# Patient Record
Sex: Male | Born: 1950 | Race: White | Hispanic: No | Smoking: Never smoker
Health system: Southern US, Community
[De-identification: ages and names within clinical notes are randomized; demographics above are authoritative.]

## PROBLEM LIST (undated history)

## (undated) DIAGNOSIS — C61 Malignant neoplasm of prostate: Secondary | ICD-10-CM

## (undated) DIAGNOSIS — Z87442 Personal history of urinary calculi: Secondary | ICD-10-CM

## (undated) DIAGNOSIS — E785 Hyperlipidemia, unspecified: Secondary | ICD-10-CM

## (undated) DIAGNOSIS — Z86010 Personal history of colonic polyps: Secondary | ICD-10-CM

## (undated) DIAGNOSIS — M1990 Unspecified osteoarthritis, unspecified site: Secondary | ICD-10-CM

## (undated) DIAGNOSIS — T7840XA Allergy, unspecified, initial encounter: Secondary | ICD-10-CM

## (undated) DIAGNOSIS — Z973 Presence of spectacles and contact lenses: Secondary | ICD-10-CM

## (undated) DIAGNOSIS — N529 Male erectile dysfunction, unspecified: Secondary | ICD-10-CM

## (undated) DIAGNOSIS — J302 Other seasonal allergic rhinitis: Secondary | ICD-10-CM

## (undated) DIAGNOSIS — I491 Atrial premature depolarization: Secondary | ICD-10-CM

## (undated) DIAGNOSIS — Z860101 Personal history of adenomatous and serrated colon polyps: Secondary | ICD-10-CM

## (undated) DIAGNOSIS — Z87898 Personal history of other specified conditions: Secondary | ICD-10-CM

## (undated) HISTORY — DX: Hyperlipidemia, unspecified: E78.5

## (undated) HISTORY — PX: PROSTATE BIOPSY: SHX241

## (undated) HISTORY — DX: Allergy, unspecified, initial encounter: T78.40XA

## (undated) HISTORY — PX: KNEE ARTHROSCOPY: SUR90

## (undated) HISTORY — PX: COLONOSCOPY: SHX174

---

## 2004-12-18 ENCOUNTER — Ambulatory Visit: Payer: Self-pay | Admitting: Internal Medicine

## 2004-12-31 ENCOUNTER — Ambulatory Visit: Payer: Self-pay | Admitting: Internal Medicine

## 2005-01-13 ENCOUNTER — Ambulatory Visit: Payer: Self-pay | Admitting: Internal Medicine

## 2005-01-13 ENCOUNTER — Encounter: Payer: Self-pay | Admitting: Internal Medicine

## 2005-08-12 ENCOUNTER — Ambulatory Visit: Payer: Self-pay | Admitting: Internal Medicine

## 2006-01-22 ENCOUNTER — Ambulatory Visit: Payer: Self-pay | Admitting: Internal Medicine

## 2006-02-04 ENCOUNTER — Ambulatory Visit: Payer: Self-pay | Admitting: Internal Medicine

## 2006-02-11 ENCOUNTER — Ambulatory Visit: Payer: Self-pay | Admitting: Internal Medicine

## 2006-12-23 ENCOUNTER — Ambulatory Visit: Payer: Self-pay | Admitting: Internal Medicine

## 2007-02-19 ENCOUNTER — Ambulatory Visit: Payer: Self-pay | Admitting: Internal Medicine

## 2007-02-19 LAB — CONVERTED CEMR LAB
ALT: 18 units/L (ref 0–40)
AST: 20 units/L (ref 0–37)
Albumin: 4.1 g/dL (ref 3.5–5.2)
Alkaline Phosphatase: 46 units/L (ref 39–117)
BUN: 14 mg/dL (ref 6–23)
Basophils Absolute: 0 10*3/uL (ref 0.0–0.1)
Basophils Relative: 0.3 % (ref 0.0–1.0)
Bilirubin, Direct: 0.1 mg/dL (ref 0.0–0.3)
CO2: 31 meq/L (ref 19–32)
Calcium: 9.1 mg/dL (ref 8.4–10.5)
Chloride: 107 meq/L (ref 96–112)
Cholesterol: 190 mg/dL (ref 0–200)
Creatinine, Ser: 1 mg/dL (ref 0.4–1.5)
Eosinophils Absolute: 0.1 10*3/uL (ref 0.0–0.6)
Eosinophils Relative: 1.6 % (ref 0.0–5.0)
GFR calc Af Amer: 100 mL/min
GFR calc non Af Amer: 82 mL/min
Glucose, Bld: 96 mg/dL (ref 70–99)
HCT: 44.3 % (ref 39.0–52.0)
HDL: 43.3 mg/dL (ref >39.0)
Hemoglobin: 15 g/dL (ref 13.0–17.0)
LDL Cholesterol: 127 mg/dL — ABNORMAL HIGH (ref 0–99)
Lymphocytes Relative: 27 % (ref 12.0–46.0)
MCHC: 33.9 g/dL (ref 30.0–36.0)
MCV: 92.7 fL (ref 78.0–100.0)
Monocytes Absolute: 0.4 10*3/uL (ref 0.2–0.7)
Monocytes Relative: 7.1 % (ref 3.0–11.0)
Neutro Abs: 4.1 10*3/uL (ref 1.4–7.7)
Neutrophils Relative %: 64 % (ref 43.0–77.0)
PSA: 1.22 ng/mL (ref 0.10–4.00)
Platelets: 337 10*3/uL (ref 150–400)
Potassium: 3.8 meq/L (ref 3.5–5.1)
RBC: 4.79 M/uL (ref 4.22–5.81)
RDW: 12.4 % (ref 11.5–14.6)
Sodium: 141 meq/L (ref 135–145)
TSH: 1.28 microintl units/mL (ref 0.35–5.50)
Total Bilirubin: 1 mg/dL (ref 0.3–1.2)
Total CHOL/HDL Ratio: 4.4
Total Protein: 7 g/dL (ref 6.0–8.3)
Triglycerides: 99 mg/dL (ref 0–149)
VLDL: 20 mg/dL (ref 0–40)
WBC: 6.3 10*3/uL (ref 4.5–10.5)

## 2007-02-26 ENCOUNTER — Ambulatory Visit: Payer: Self-pay | Admitting: Internal Medicine

## 2007-03-04 DIAGNOSIS — R7611 Nonspecific reaction to tuberculin skin test without active tuberculosis: Secondary | ICD-10-CM

## 2007-03-04 DIAGNOSIS — Z86010 Personal history of colonic polyps: Secondary | ICD-10-CM

## 2007-06-03 DIAGNOSIS — J301 Allergic rhinitis due to pollen: Secondary | ICD-10-CM

## 2007-07-05 ENCOUNTER — Ambulatory Visit: Payer: Self-pay | Admitting: Family Medicine

## 2008-03-15 ENCOUNTER — Ambulatory Visit: Payer: Self-pay | Admitting: Internal Medicine

## 2008-03-15 LAB — CONVERTED CEMR LAB
ALT: 26 units/L (ref 0–53)
AST: 26 units/L (ref 0–37)
Alkaline Phosphatase: 36 units/L — ABNORMAL LOW (ref 39–117)
BUN: 12 mg/dL (ref 6–23)
Basophils Relative: 1 % (ref 0.0–1.0)
Blood in Urine, dipstick: NEGATIVE
CO2: 30 meq/L (ref 19–32)
Chloride: 109 meq/L (ref 96–112)
Eosinophils Absolute: 0.1 10*3/uL (ref 0.0–0.7)
Eosinophils Relative: 2.3 % (ref 0.0–5.0)
GFR calc non Af Amer: 82 mL/min
Glucose, Urine, Semiquant: NEGATIVE
HDL: 39.7 mg/dL (ref >39.0)
LDL Cholesterol: 128 mg/dL — ABNORMAL HIGH (ref 0–99)
Lymphocytes Relative: 31.6 % (ref 12.0–46.0)
MCV: 93.5 fL (ref 78.0–100.0)
Neutrophils Relative %: 58 % (ref 43.0–77.0)
Nitrite: NEGATIVE
Platelets: 303 10*3/uL (ref 150–400)
Potassium: 4.2 meq/L (ref 3.5–5.1)
RBC: 4.61 M/uL (ref 4.22–5.81)
Specific Gravity, Urine: 1.02
Total Bilirubin: 0.9 mg/dL (ref 0.3–1.2)
Total CHOL/HDL Ratio: 4.7
Urobilinogen, UA: 0.2
VLDL: 17 mg/dL (ref 0–40)
WBC Urine, dipstick: NEGATIVE
WBC: 6 10*3/uL (ref 4.5–10.5)

## 2008-03-29 ENCOUNTER — Ambulatory Visit: Payer: Self-pay | Admitting: Internal Medicine

## 2008-11-24 ENCOUNTER — Emergency Department (HOSPITAL_COMMUNITY): Admission: EM | Admit: 2008-11-24 | Discharge: 2008-11-24 | Payer: Self-pay | Admitting: Family Medicine

## 2008-11-24 ENCOUNTER — Telehealth: Payer: Self-pay | Admitting: Internal Medicine

## 2010-01-07 ENCOUNTER — Ambulatory Visit: Payer: Self-pay | Admitting: Internal Medicine

## 2010-01-07 DIAGNOSIS — L570 Actinic keratosis: Secondary | ICD-10-CM

## 2010-01-10 ENCOUNTER — Ambulatory Visit: Payer: Self-pay | Admitting: Internal Medicine

## 2010-01-10 LAB — CONVERTED CEMR LAB
ALT: 22 units/L (ref 0–53)
AST: 23 units/L (ref 0–37)
Albumin: 4.2 g/dL (ref 3.5–5.2)
Alkaline Phosphatase: 52 units/L (ref 39–117)
BUN: 19 mg/dL (ref 6–23)
Basophils Absolute: 0.1 10*3/uL (ref 0.0–0.1)
Basophils Relative: 0.7 % (ref 0.0–3.0)
Chloride: 105 meq/L (ref 96–112)
Creatinine, Ser: 1 mg/dL (ref 0.4–1.5)
Direct LDL: 141.4 mg/dL
Eosinophils Relative: 2.4 % (ref 0.0–5.0)
Glucose, Bld: 101 mg/dL — ABNORMAL HIGH (ref 70–99)
Hemoglobin, Urine: NEGATIVE
Hemoglobin: 15.2 g/dL (ref 13.0–17.0)
Ketones, ur: NEGATIVE mg/dL
Lymphocytes Relative: 30.2 % (ref 12.0–46.0)
Monocytes Relative: 6.6 % (ref 3.0–12.0)
Neutro Abs: 4.2 10*3/uL (ref 1.4–7.7)
Potassium: 5.1 meq/L (ref 3.5–5.1)
RBC: 4.77 M/uL (ref 4.22–5.81)
Total CHOL/HDL Ratio: 4
Total Protein, Urine: NEGATIVE mg/dL
Total Protein: 7.2 g/dL (ref 6.0–8.3)
Urine Glucose: NEGATIVE mg/dL
VLDL: 15.8 mg/dL (ref 0.0–40.0)
WBC: 7 10*3/uL (ref 4.5–10.5)

## 2010-01-18 ENCOUNTER — Ambulatory Visit: Payer: Self-pay | Admitting: Internal Medicine

## 2010-01-23 ENCOUNTER — Encounter (INDEPENDENT_AMBULATORY_CARE_PROVIDER_SITE_OTHER): Payer: Self-pay | Admitting: *Deleted

## 2010-09-23 ENCOUNTER — Encounter: Payer: Self-pay | Admitting: Internal Medicine

## 2010-10-01 NOTE — Assessment & Plan Note (Signed)
Summary: ?lump under freckle/njr   Vital Signs:  Patient profile:   60 year old male Weight:      203 pounds BMI:     30.31 Temp:     98.4 degrees F oral Pulse rate:   72 / minute Pulse rhythm:   regular Resp:     12 per minute BP sitting:   122 / 66  (left arm) Cuff size:   regular  Vitals Entered By: Gladis Riffle, RN (Jan 07, 2010 9:03 AM)  Procedure Note Last Tetanus: Td (02/26/2007)  Wart Removal: Onset of lesion: months-years Indication: aktinic keratosis  Procedure # 1: cryotherapy    Location: forehead    # lesions removed: 1    Comment: to   CC: c/o lump under freckle left forehead x 5 weeks Is Patient Diabetic? No   CC:  c/o lump under freckle left forehead x 5 weeks.  History of Present Illness: 5 week hx of mole left forehead discoloration has been there for years no pain minimal itching no bleeding/  All other systems reviewed and were negative   Preventive Screening-Counseling & Management  Alcohol-Tobacco     Smoking Status: never  Current Medications (verified): 1)  Ibuprofen 200 Mg Tabs (Ibuprofen) .... Take 1 Tablet By Mouth Every Eight Hours 2)  Zyrtec Allergy 10 Mg  Tabs (Cetirizine Hcl) .... Once Daily As Needed 3)  Claritin-D 12 Hour 5-120 Mg  Xr12h-Tab (Loratadine-Pseudoephedrine) .... Once Daily As Needed  Allergies (verified): No Known Drug Allergies  Physical Exam  General:  Well-developed,well-nourished,in no acute distress; alert,appropriate and cooperative throughout examination Head:  normocephalic and atraumatic.   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 Take 1 tablet by mouth every  eight hours 2)  Zyrtec Allergy 10 Mg Tabs (Cetirizine hcl) .... Once daily as needed 3)  Claritin-d 12 Hour 5-120 Mg Xr12h-tab (Loratadine-pseudoephedrine) .... Once daily as needed

## 2010-10-01 NOTE — Letter (Signed)
Summary: Colonoscopy Letter   Gastroenterology  [address] [place], [place] [address]   Phone: [phone]  Fax: [phone]      Jan 23, 2010 MRN: [id number]   [facility] [address] [place], [place]  [address]   Dear Mr. Creasey,   According to your medical record, it is time for you to schedule a Colonoscopy. The American Cancer Society recommends this procedure as a method to detect early colon cancer. Patients with a family history of colon cancer, or a personal history of colon polyps or inflammatory bowel disease are at increased risk.  This letter has beeen generated based on the recommendations made at the time of your procedure. If you feel that in your particular situation this may no longer apply, please contact our office.  Please call our office at [phone] to schedule this appointment or to update your records at your earliest convenience.  Thank you for cooperating with [place] to provide you with the very best care possible.   Sincerely,  Iva Boop, M.D.  [facility] Gastroenterology Division [phone]

## 2010-10-01 NOTE — Procedures (Signed)
Summary: Colonoscopy/[facility]  Colonoscopy/[facility]   Imported By: Lanelle Bal 01/18/2010 08:58:39  _____________________________________________________________________  External Attachment:    Type:   Image     Comment:   External Document

## 2010-10-01 NOTE — Assessment & Plan Note (Signed)
Summary: cpx/cjr   Vital Signs:  Patient profile:   60 year old male Height:      68.75 inches Weight:      202 pounds Temp:     97.3 degrees F oral BP sitting:   124 / 70  (left arm) Cuff size:   regular  Vitals Entered By: Kern Reap CMA Duncan Dull) (Jan 18, 2010 7:52 AM) CC: cpx   CC:  cpx.  History of Present Illness: cpx  Current Problems (verified): 1)  Actinic Keratosis  (ICD-702.0) 2)  Preventive Health Care  (ICD-V70.0) 3)  Allergic Rhinitis, Seasonal  (ICD-477.0) 4)  Positive Ppd  (ICD-795.5) 5)  Colonic Polyps, Hx of  (ICD-V12.72)  Current Medications (verified): 1)  Ibuprofen 200 Mg Tabs (Ibuprofen) .... Take 1 Tablet By Mouth Every Eight Hours 2)  Zyrtec Allergy 10 Mg  Tabs (Cetirizine Hcl) .... Once Daily As Needed 3)  Claritin-D 12 Hour 5-120 Mg  Xr12h-Tab (Loratadine-Pseudoephedrine) .... 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   Head:  normocephalic and atraumatic.   Eyes:  pupils equal and pupils round.   Ears:  R ear normal and L ear normal.   Nose:  no external deformity and no external erythema.   Neck:  No deformities, masses, or tenderness noted. Chest Wall:  No deformities, masses, tenderness or gynecomastia noted. Lungs:  normal respiratory effort and no intercostal retractions.   Heart:  normal rate and regular rhythm.    Abdomen:  soft and non-tender.   Rectal:  no external abnormalities and no hemorrhoids.   Msk:  No deformity or scoliosis noted of thoracic or lumbar spine.   Pulses:  R radial normal and L radial normal.   Neurologic:  cranial nerves II-XII intact and gait normal.   Skin:  well circumscribed lesion 1.2cm slightly raised, without hyperpigmentation Cervical Nodes:  no anterior cervical adenopathy and no posterior cervical adenopathy.   Psych:  memory intact for recent and remote.     Impression & Recommendations:  Problem # 1:  PREVENTIVE HEALTH CARE (ICD-V70.0) health maint utd advised daily exercise  Complete Medication List: 1)  Ibuprofen 200 Mg Tabs (Ibuprofen) .... Take 1 tablet by mouth every eight hours

## 2010-10-03 NOTE — Miscellaneous (Signed)
Summary: Orders Update   Clinical Lists Changes  Orders: Added new Referral order of Gastroenterology Referral (GI) - Signed 

## 2011-01-28 ENCOUNTER — Encounter: Payer: Self-pay | Admitting: Internal Medicine

## 2011-02-25 ENCOUNTER — Ambulatory Visit (AMBULATORY_SURGERY_CENTER): Payer: BC Managed Care – PPO | Admitting: *Deleted

## 2011-02-25 VITALS — Ht 69.0 in | Wt 196.7 lb

## 2011-02-25 DIAGNOSIS — Z86010 Personal history of colonic polyps: Secondary | ICD-10-CM

## 2011-02-25 MED ORDER — PEG-KCL-NACL-NASULF-NA ASC-C 100 G PO SOLR: ORAL | Status: DC

## 2011-03-11 ENCOUNTER — Encounter: Payer: Self-pay | Admitting: Internal Medicine

## 2011-03-11 ENCOUNTER — Ambulatory Visit (AMBULATORY_SURGERY_CENTER): Payer: BC Managed Care – PPO | Admitting: Internal Medicine

## 2011-03-11 VITALS — BP 107/66 | HR 56 | Temp 97.6°F | Resp 16 | Ht 69.0 in | Wt 193.0 lb

## 2011-03-11 DIAGNOSIS — K5730 Diverticulosis of large intestine without perforation or abscess without bleeding: Secondary | ICD-10-CM

## 2011-03-11 DIAGNOSIS — D126 Benign neoplasm of colon, unspecified: Secondary | ICD-10-CM

## 2011-03-11 DIAGNOSIS — Z1211 Encounter for screening for malignant neoplasm of colon: Secondary | ICD-10-CM

## 2011-03-11 DIAGNOSIS — Z86010 Personal history of colonic polyps: Secondary | ICD-10-CM

## 2011-03-11 MED ORDER — SODIUM CHLORIDE 0.9 % IV SOLN: 500.0000 mL | INTRAVENOUS | Status: DC

## 2011-03-11 NOTE — Patient Instructions (Signed)
Please review all discharge papers given to you by the recovery room nurse.  One polyp removed and mild diverticulosis in the sigmoid colon. Please call [phone] if you experience any problems once you are discharged.  You will receive a phone call in the am from one of our nurses to see how you are doing and answer any questions you may have.  Thank you

## 2011-03-12 ENCOUNTER — Telehealth: Payer: Self-pay | Admitting: *Deleted

## 2011-03-12 NOTE — Telephone Encounter (Signed)

## 2011-03-20 NOTE — Progress Notes (Signed)
Quick Note:  8 mm cecal adenoma Diverticulosis Internal hemorrhoids Colon recall 2017 ______

## 2012-06-16 ENCOUNTER — Ambulatory Visit: Payer: BC Managed Care – PPO

## 2013-02-15 ENCOUNTER — Other Ambulatory Visit (INDEPENDENT_AMBULATORY_CARE_PROVIDER_SITE_OTHER): Payer: BC Managed Care – PPO

## 2013-02-15 DIAGNOSIS — Z0000 Encounter for general adult medical examination without abnormal findings: Secondary | ICD-10-CM

## 2013-02-15 LAB — HEPATIC FUNCTION PANEL
ALT: 23 U/L (ref 0–53)
AST: 25 U/L (ref 0–37)
Alkaline Phosphatase: 45 U/L (ref 39–117)
Bilirubin, Direct: 0.1 mg/dL (ref 0.0–0.3)
Total Bilirubin: 0.6 mg/dL (ref 0.3–1.2)
Total Protein: 6.9 g/dL (ref 6.0–8.3)

## 2013-02-15 LAB — BASIC METABOLIC PANEL
BUN: 16 mg/dL (ref 6–23)
CO2: 23 mEq/L (ref 19–32)
Chloride: 107 mEq/L (ref 96–112)
Creatinine, Ser: 1 mg/dL (ref 0.4–1.5)
Glucose, Bld: 105 mg/dL — ABNORMAL HIGH (ref 70–99)

## 2013-02-15 LAB — POCT URINALYSIS DIPSTICK
Bilirubin, UA: NEGATIVE
Blood, UA: NEGATIVE
Glucose, UA: NEGATIVE
Leukocytes, UA: NEGATIVE
Nitrite, UA: POSITIVE

## 2013-02-15 LAB — LIPID PANEL
Cholesterol: 186 mg/dL (ref 0–200)
Total CHOL/HDL Ratio: 4
Triglycerides: 60 mg/dL (ref 0.0–149.0)

## 2013-02-15 LAB — CBC WITH DIFFERENTIAL/PLATELET
Basophils Absolute: 0 10*3/uL (ref 0.0–0.1)
Basophils Relative: 0.6 % (ref 0.0–3.0)
Eosinophils Absolute: 0.1 10*3/uL (ref 0.0–0.7)
MCHC: 34.1 g/dL (ref 30.0–36.0)
MCV: 92.4 fl (ref 78.0–100.0)
Monocytes Absolute: 0.6 10*3/uL (ref 0.1–1.0)
Neutrophils Relative %: 59.7 % (ref 43.0–77.0)
Platelets: 295 10*3/uL (ref 150.0–400.0)
RDW: 13.6 % (ref 11.5–14.6)

## 2013-02-15 LAB — TSH: TSH: 1.53 u[IU]/mL (ref 0.35–5.50)

## 2013-02-17 ENCOUNTER — Other Ambulatory Visit: Payer: Self-pay | Admitting: *Deleted

## 2013-02-17 DIAGNOSIS — R972 Elevated prostate specific antigen [PSA]: Secondary | ICD-10-CM

## 2013-02-17 MED ORDER — CIPROFLOXACIN HCL 500 MG PO TABS: 500.0000 mg | ORAL_TABLET | Freq: Two times a day (BID) | ORAL | Status: DC

## 2013-02-18 ENCOUNTER — Other Ambulatory Visit: Payer: BC Managed Care – PPO

## 2013-02-22 ENCOUNTER — Encounter: Payer: Self-pay | Admitting: Internal Medicine

## 2013-02-22 ENCOUNTER — Ambulatory Visit (INDEPENDENT_AMBULATORY_CARE_PROVIDER_SITE_OTHER): Payer: BC Managed Care – PPO | Admitting: Internal Medicine

## 2013-02-22 VITALS — BP 132/82 | HR 68 | Temp 98.4°F | Ht 69.5 in | Wt 194.0 lb

## 2013-02-22 DIAGNOSIS — Z2911 Encounter for prophylactic immunotherapy for respiratory syncytial virus (RSV): Secondary | ICD-10-CM

## 2013-02-22 DIAGNOSIS — Z0000 Encounter for general adult medical examination without abnormal findings: Secondary | ICD-10-CM

## 2013-02-24 NOTE — Progress Notes (Signed)
Patient ID: Randy Rogers, male   DOB: 04/08/1951, 62 y.o.   MRN: [id number] cpx  History reviewed. No pertinent past medical history.  History   Social History  . Marital Status: Married    Spouse Name: N/A    Number of Children: N/A  . Years of Education: N/A   Occupational History  . Not on file.   Social History Main Topics  . Smoking status: Never Smoker   . Smokeless tobacco: Not on file  . Alcohol Use: 7.0 oz/week    14 drink(s) per week  . Drug Use: No  . Sexually Active: Not on file   Other Topics Concern  . Not on file   Social History Narrative   Adopted by grandparents      Works for [employer]          Past Surgical History  Procedure Laterality Date  . Colonoscopy  2006; 03/11/2011    5 mm polyp removed; 8mm cecal polyp and diverticulosis  . Knee arthroscopy      left    Family History  Problem Relation Age of Onset  . Tuberculosis Mother     No Known Allergies  Current Outpatient Prescriptions on File Prior to Visit  Medication Sig Dispense Refill  . ciprofloxacin (CIPRO) 500 MG tablet Take 1 tablet (500 mg total) by mouth 2 (two) times daily.  20 tablet  0  . fexofenadine (ALLEGRA) 180 MG tablet Take 180 mg by mouth as needed.        Marland Kitchen ibuprofen (ADVIL,MOTRIN) 200 MG tablet Take 200 mg by mouth every 6 (six) hours as needed.         Current Facility-Administered Medications on File Prior to Visit  Medication Dose Route Frequency Provider Last Rate Last Dose  . 0.9 %  sodium chloride infusion  500 mL Intravenous Continuous Iva Boop, MD         patient denies chest pain, shortness of breath, orthopnea. Denies lower extremity edema, abdominal pain, change in appetite, change in bowel movements. Patient denies rashes, musculoskeletal complaints. No other specific complaints in a complete review of systems.   BP 132/82  Pulse 68  Temp(Src) 98.4 F (36.9 C) (Oral)  Ht 5' 9.5" (1.765 m)  Wt 194 lb (87.998 kg)  BMI 28.25 kg/m2  well-developed well-nourished male in no acute distress. HEENT exam atraumatic, normocephalic, neck supple without jugular venous distention. Chest clear to auscultation cardiac exam S1-S2 are regular. Abdominal exam overweight with bowel sounds, soft and nontender. Extremities no edema. Neurologic exam is alert with a normal gait.  Well visit- health maint utd

## 2013-04-04 ENCOUNTER — Other Ambulatory Visit (INDEPENDENT_AMBULATORY_CARE_PROVIDER_SITE_OTHER): Payer: BC Managed Care – PPO

## 2013-04-04 DIAGNOSIS — R972 Elevated prostate specific antigen [PSA]: Secondary | ICD-10-CM

## 2014-04-10 ENCOUNTER — Encounter: Payer: Self-pay | Admitting: Physician Assistant

## 2014-04-10 ENCOUNTER — Ambulatory Visit (INDEPENDENT_AMBULATORY_CARE_PROVIDER_SITE_OTHER): Payer: BC Managed Care – PPO | Admitting: Physician Assistant

## 2014-04-10 VITALS — BP 120/80 | HR 66 | Temp 98.1°F | Resp 18 | Wt 208.0 lb

## 2014-04-10 DIAGNOSIS — L259 Unspecified contact dermatitis, unspecified cause: Secondary | ICD-10-CM

## 2014-04-10 MED ORDER — TRIAMCINOLONE ACETONIDE 0.025 % EX OINT: 1.0000 "application " | TOPICAL_OINTMENT | Freq: Four times a day (QID) | CUTANEOUS | Status: DC

## 2014-04-10 NOTE — Progress Notes (Signed)
Subjective:    Patient ID: Randy Rogers, male    DOB: 12/13/1950, 63 y.o.   MRN: [id number]  Rash This is a new problem. The current episode started 1 to 4 weeks ago. The problem has been gradually worsening since onset. The affected locations include the right ankle. The rash is characterized by redness, itchiness and blistering. It is unknown if there was an exposure to a precipitant. Pertinent negatives include no anorexia, congestion, cough, diarrhea, eye pain, facial edema, fatigue, fever, joint pain, nail changes, rhinorrhea, shortness of breath, sore throat or vomiting. Treatments tried: antifungal cream, antibiotic ointment, topical antihistamine. The treatment provided no relief. There is no history of allergies, asthma or eczema.      Review of Systems  Constitutional: Negative for fever, chills and fatigue.  HENT: Negative for congestion, rhinorrhea and sore throat.   Eyes: Negative for pain.  Respiratory: Negative for cough and shortness of breath.   Cardiovascular: Negative for chest pain.  Gastrointestinal: Negative for nausea, vomiting, diarrhea and anorexia.  Musculoskeletal: Negative for joint pain.  Skin: Positive for rash. Negative for nail changes.  Neurological: Negative for syncope and headaches.  All other systems reviewed and are negative.      History reviewed. No pertinent past medical history.  History   Social History  . Marital Status: Married    Spouse Name: N/A    Number of Children: N/A  . Years of Education: N/A   Occupational History  . Not on file.   Social History Main Topics  . Smoking status: Never Smoker   . Smokeless tobacco: Not on file  . Alcohol Use: 7.0 oz/week    14 drink(s) per week  . Drug Use: No  . Sexual Activity: Not on file   Other Topics Concern  . Not on file   Social History Narrative   Adopted by grandparents      Works for [employer]          Past Surgical History  Procedure Laterality Date    . Colonoscopy  2006; 03/11/2011    5 mm polyp removed; 63mm cecal polyp and diverticulosis  . Knee arthroscopy      left    Family History  Problem Relation Age of Onset  . Tuberculosis Mother     No Known Allergies  Current Outpatient Prescriptions on File Prior to Visit  Medication Sig Dispense Refill  . fexofenadine (ALLEGRA) 180 MG tablet Take 180 mg by mouth as needed.        Marland Kitchen ibuprofen (ADVIL,MOTRIN) 200 MG tablet Take 200 mg by mouth every 6 (six) hours as needed.         Current Facility-Administered Medications on File Prior to Visit  Medication Dose Route Frequency Provider Last Rate Last Dose  . 0.9 %  sodium chloride infusion  500 mL Intravenous Continuous Gatha Mayer, MD        EXAM: BP 120/80  Pulse 66  Temp(Src) 98.1 F (36.7 C) (Oral)  Resp 18  Wt 208 lb (94.348 kg)     Objective:   Physical Exam  Nursing note and vitals reviewed. Constitutional: He is oriented to person, place, and time. He appears well-developed and well-nourished. No distress.  HENT:  Head: Normocephalic and atraumatic.  Eyes: Conjunctivae and EOM are normal. Pupils are equal, round, and reactive to light.  Cardiovascular: Normal rate, regular rhythm and intact distal pulses.   Pulmonary/Chest: Effort normal and breath sounds normal. No respiratory distress.  Neurological:  He is alert and oriented to person, place, and time.  Skin: Skin is warm and dry. Rash noted. He is not diaphoretic. No pallor.  Right ankle- there is a localized area of erythematous papular rash over the medial right ankle. This is not warm to the touch, not tender to palpation, not fluctuant, not swollen. There is no sign of a break in skin or puncture.  Psychiatric: He has a normal mood and affect. His behavior is normal. Judgment and thought content normal.    Lab Results  Component Value Date   WBC 7.0 02/15/2013   HGB 15.2 02/15/2013   HCT 44.4 02/15/2013   PLT 295.0 02/15/2013   GLUCOSE 105* 02/15/2013    CHOL 186 02/15/2013   TRIG 60.0 02/15/2013   HDL 51.60 02/15/2013   LDLDIRECT 141.4 01/10/2010   LDLCALC 122* 02/15/2013   ALT 23 02/15/2013   AST 25 02/15/2013   NA 141 02/15/2013   K 5.0 02/15/2013   CL 107 02/15/2013   CREATININE 1.0 02/15/2013   BUN 16 02/15/2013   CO2 23 02/15/2013   TSH 1.53 02/15/2013   PSA 1.24 04/04/2013         Assessment & Plan:  Khoen was seen today for rash.  Diagnoses and associated orders for this visit:  Contact dermatitis Comments: Effecting 1 area, ankle only. Trial topical Triamcinolone gel over the area. - triamcinolone (KENALOG) 0.025 % ointment; Apply 1 application topically 4 (four) times daily.    Return precautions provided, and patient handout on contact dermatitis.  Plan to follow up as needed, or for worsening or persistent symptoms despite treatment.  Patient Instructions  Triamcinolone ointment applied to area 4 times daily until rash is completely resolved.  If emergency symptoms discussed during visit developed, seek medical attention immediately.  Followup as needed, or for worsening or persistent symptoms despite treatment.

## 2014-04-10 NOTE — Patient Instructions (Addendum)
Triamcinolone ointment applied to area 4 times daily until rash is completely resolved.  If emergency symptoms discussed during visit developed, seek medical attention immediately.  Followup as needed, or for worsening or persistent symptoms despite treatment.   Contact Dermatitis Contact dermatitis is a rash that happens when something touches the skin. You touched something that irritates your skin, or you have allergies to something you touched. HOME CARE   Avoid the thing that caused your rash.  Keep your rash away from hot water, soap, sunlight, chemicals, and other things that might bother it.  Do not scratch your rash.  You can take cool baths to help stop itching.  Only take medicine as told by your doctor.  Keep all doctor visits as told. GET HELP RIGHT AWAY IF:   Your rash is not better after 3 days.  Your rash gets worse.  Your rash is puffy (swollen), tender, red, sore, or warm.  You have problems with your medicine. MAKE SURE YOU:   Understand these instructions.  Will watch your condition.  Will get help right away if you are not doing well or get worse. Document Released: 06/15/2009 Document Revised: 11/10/2011 Document Reviewed: 01/21/2011 [facility] Patient Information 2015 [place], [place]. This information is not intended to replace advice given to you by your health care provider. Make sure you discuss any questions you have with your health care provider.

## 2014-06-14 ENCOUNTER — Encounter: Payer: Self-pay | Admitting: Family Medicine

## 2014-06-14 ENCOUNTER — Ambulatory Visit (INDEPENDENT_AMBULATORY_CARE_PROVIDER_SITE_OTHER): Payer: BC Managed Care – PPO | Admitting: Family Medicine

## 2014-06-14 VITALS — BP 130/82 | HR 72 | Temp 97.6°F | Ht 69.0 in | Wt 204.0 lb

## 2014-06-14 DIAGNOSIS — IMO0001 Reserved for inherently not codable concepts without codable children: Secondary | ICD-10-CM

## 2014-06-14 DIAGNOSIS — E785 Hyperlipidemia, unspecified: Secondary | ICD-10-CM

## 2014-06-14 DIAGNOSIS — Z0000 Encounter for general adult medical examination without abnormal findings: Secondary | ICD-10-CM

## 2014-06-14 NOTE — Patient Instructions (Addendum)
Things look great today.   Let's see each other in a year and recheck bloodwork at that time.

## 2014-06-14 NOTE — Assessment & Plan Note (Signed)
10 year risk 9.5% but patient with no known family history (adopted though), walks at least 4 miles a day, and eats a very healthy diet. Discussed benefits/risks of statin and patient firmly against. Discussed I would likely recommend a statin more strongly when he gets above 15 or 20% or definitely in case of secondary prevention.

## 2014-06-14 NOTE — Progress Notes (Signed)
Randy Reddish, MD Phone: [phone]  Subjective:  Patient presents today to establish care with me as their new primary care provider and for annual physical. Patient was formerly a patient of Dr. Leanne Chang. Chief complaint-noted.   Hyperlipidemia-mild poor control  Lab Results  Component Value Date   LDLCALC 122* 02/15/2013  10 year risk 9.5% Regular exercise: used to walk 6-7 miles a day, recently recced to 4-5 miles a day but will increase again Diet: at least 5 servings of fruits/veggies per day ROS- no chest pain or shortness of breath. No myalgias. Complete review of systems performed and negative.   The following were reviewed and entered/updated in epic: No past medical history on file. Patient Active Problem List   Diagnosis Date Noted  . Hyperlipidemia 06/14/2014    Priority: Medium  . Actinic keratosis 01/07/2010    Priority: Low  . ALLERGIC RHINITIS, SEASONAL 06/03/2007    Priority: Low  . 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 COLONIC POLYPS, HX OF 03/04/2007    Priority: Low   Past Surgical History  Procedure Laterality Date  . Colonoscopy  2006; 03/11/2011    5 mm polyp removed; 44mm cecal polyp and diverticulosis  . Knee arthroscopy      left   Family History  Problem Relation Age of Onset  . Tuberculosis Mother   . Emphysema Maternal Grandfather     smoker    Medications- reviewed and updated Current Outpatient Prescriptions  Medication Sig Dispense Refill  . fexofenadine (ALLEGRA) 180 MG tablet Take 180 mg by mouth as needed.        Marland Kitchen ibuprofen (ADVIL,MOTRIN) 200 MG tablet Take 200 mg by mouth every 6 (six) hours as needed.         Current Facility-Administered Medications  Medication Dose Route Frequency Provider Last Rate Last Dose  . 0.9 %  sodium chloride infusion  500 mL Intravenous Continuous Gatha Mayer, MD        Allergies-reviewed and updated No Known Allergies  History   Social History  . Marital Status: Married   Spouse Name: N/A    Number of Children: N/A  . Years of Education: N/A   Social History Main Topics  . Smoking status: Never Smoker   . Smokeless tobacco: None  . Alcohol Use: 7.0 oz/week    14 drink(s) per week  . Drug Use: No  . Sexual Activity: None   Other Topics Concern  . None   Social History Narrative   Divorced twice. 2 adult children and daughter (2003). 1 granddaughter.       Adopted by grandparents.      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 Oropharynx normal. Reasonable dentition.  CV: RRR no murmurs rubs or gallops Did note 1-2 ectopic beats over about 30 beats but listened 1 minute later and no recurrence Lungs: CTAB no crackles, wheeze, rhonchi Abdomen: soft/nontender/nondistended/normal bowel sounds. No rebound or guarding.  Ext: no edema Skin: warm, dry Neuro: grossly normal, moves all extremities, PERRLA  Assessment/Plan:  63 y.o. male presenting for annual physical.  Health Maintenance counseling: 1. Anticipatory guidance: Patient counseled regarding  regular dental exams, wearing seatbelts.  2. Risk factor reduction:  Advised patient of need for regular exercise and diet rich and fruits and vegetables to reduce risk of heart attack and stroke.  3. Immunizations/screenings/ancillary studies- up to date 4. Discussed updating labwork and patient would like to defer for 1 year 5. Discussed ectopic beats could simply be PVCs. Patient asymptomatic at this time. Discussed if symptoms would ask him to return to care (CP, SOB, palpitations).   Hyperlipidemia 10 year risk 9.5% but patient with no known family history (adopted though), walks at least 4 miles a day, and eats a very healthy diet. Discussed  benefits/risks of statin and patient firmly against. Discussed I would likely recommend a statin more strongly when he gets above 15 or 20% or definitely in case of secondary prevention.    F/u physical in 1 year to include PSA and rectal potentially (though patient is considering not doing prostate cancer screening).

## 2014-11-07 ENCOUNTER — Ambulatory Visit (INDEPENDENT_AMBULATORY_CARE_PROVIDER_SITE_OTHER): Payer: BLUE CROSS/BLUE SHIELD | Admitting: Family Medicine

## 2014-11-07 ENCOUNTER — Encounter: Payer: Self-pay | Admitting: Family Medicine

## 2014-11-07 VITALS — BP 122/72 | HR 64 | Temp 98.1°F | Ht 69.0 in | Wt 205.3 lb

## 2014-11-07 DIAGNOSIS — Z23 Encounter for immunization: Secondary | ICD-10-CM

## 2014-11-07 DIAGNOSIS — L03113 Cellulitis of right upper limb: Secondary | ICD-10-CM

## 2014-11-07 MED ORDER — SULFAMETHOXAZOLE-TRIMETHOPRIM 800-160 MG PO TABS: 1.0000 | ORAL_TABLET | Freq: Two times a day (BID) | ORAL | Status: DC

## 2014-11-07 MED ORDER — CEPHALEXIN 500 MG PO CAPS: 500.0000 mg | ORAL_CAPSULE | Freq: Three times a day (TID) | ORAL | Status: DC

## 2014-11-07 NOTE — Progress Notes (Signed)
Pre visit review using our clinic review tool, if applicable. No additional management support is needed unless otherwise documented below in the visit note. 

## 2014-11-07 NOTE — Progress Notes (Addendum)
  HPI:  Acute visit for:  Abrasion of arm: -scratch by thorn while gardening a few days ago -reports washed well, now since last night developing little redness and soreness in this area -denies: fevers, malaise  ROS: See pertinent positives and negatives per HPI.  No past medical history on file.  Past Surgical History  Procedure Laterality Date  . Colonoscopy  2006; 03/11/2011    5 mm polyp removed; 76mm cecal polyp and diverticulosis  . Knee arthroscopy      left    Family History  Problem Relation Age of Onset  . Tuberculosis Mother   . Emphysema Maternal Grandfather     smoker    History   Social History  . Marital Status: Married    Spouse Name: N/A  . Number of Children: N/A  . Years of Education: N/A   Social History Main Topics  . Smoking status: Never Smoker   . Smokeless tobacco: Not on file  . Alcohol Use: 7.0 oz/week    14 drink(s) per week  . Drug Use: No  . Sexual Activity: Not on file   Other Topics Concern  . None   Social History Narrative   Divorced twice. 2 adult children and daughter (2003). 1 granddaughter.       Adopted by grandparents.      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  fexofenadine (ALLEGRA) 180 MG tablet, Take 180 mg by mouth as needed.  , Disp: , Rfl:  .  ibuprofen (ADVIL,MOTRIN) 200 MG tablet, Take 200 mg by mouth every 6 (six) hours as needed.  , Disp: , Rfl:  .  cephALEXin (KEFLEX) 500 MG capsule, Take 1 capsule (500 mg total) by mouth 3 (three) times daily., Disp: 21 capsule, Rfl: 0 .  sulfamethoxazole-trimethoprim (BACTRIM DS) 800-160 MG per tablet, Take 1 tablet by mouth 2 (two) times daily., Disp: 14 tablet, Rfl: 0  Current facility-administered medications:  .  0.9 %  sodium chloride infusion, 500 mL, Intravenous, Continuous, Gatha Mayer, MD  EXAMDanley Danker Vitals:   11/07/14 0917  BP: 122/72  Pulse: 64  Temp: 98.1 F (36.7 C)     Body mass index is 30.3 kg/(m^2).  GENERAL: vitals reviewed and listed above, alert, oriented, appears well hydrated and in no acute distress  HEENT: atraumatic, conjunttiva clear, no obvious abnormalities on inspection of external nose and ears  NECK: no obvious masses on inspection  SKIN: superficial excoriation of R dorsal forearm with development of surrounding induration and erythema at proximal portion of lesion - no fluctuance or drainage of exam today  MS: moves all extremities without noticeable abnormality  PSYCH: pleasant and cooperative, no obvious depression or anxiety  ASSESSMENT AND PLAN:  Discussed the following assessment and plan:  Cellulitis of right upper extremity - Plan: sulfamethoxazole-trimethoprim (BACTRIM DS) 800-160 MG per tablet, cephALEXin (KEFLEX) 500 MG capsule  -opted to start bactrim and keflex for likely bacterial cellulitis to cover strep and staph organisms -fungal etiology unlikely given time frame but discussed this rare cause of cutaneous infections related to rose thorn wounds -Tdap  -follow up in 2-3 days -Patient advised to return or notify a doctor immediately if symptoms worsen or persist or new concerns arise.  There are no Patient Instructions on file for this visit.   Colin Benton R.

## 2014-11-07 NOTE — Addendum Note (Signed)
Addended by: Agnes Lawrence on: 11/07/2014 09:48 AM   Modules accepted: Orders

## 2014-11-07 NOTE — Patient Instructions (Signed)
BEFORE YOU LEAVE: -Tdap -schedule follow up in 2-3 days  Take the antibiotics as instructed -As we discussed, we have prescribed a new medication for you at this appointment. We discussed the common and serious potential adverse effects of this medication and you can review these and more with the pharmacist when you pick up your medication.  Please follow the instructions for use carefully and notify us immediately if you have any problems taking this medication.  Follow up as scheduled or seek care immediately if worsening or fevers, chills or malaise

## 2014-11-09 ENCOUNTER — Ambulatory Visit (INDEPENDENT_AMBULATORY_CARE_PROVIDER_SITE_OTHER): Payer: BLUE CROSS/BLUE SHIELD | Admitting: Family Medicine

## 2014-11-09 ENCOUNTER — Encounter: Payer: Self-pay | Admitting: Family Medicine

## 2014-11-09 VITALS — BP 128/76 | Temp 97.9°F | Wt 206.0 lb

## 2014-11-09 DIAGNOSIS — L03113 Cellulitis of right upper limb: Secondary | ICD-10-CM

## 2014-11-09 NOTE — Progress Notes (Signed)
  Garret Reddish, MD Phone: [phone]  Subjective:   Randy Rogers is a 64 y.o. year old very pleasant male patient who presents with the following:  Cellulitis follow up -started after thorn injury. Seen 2 days ago for expanding redness and determined to be cellulitis by Dr. Maudie Mercury. Started on bactrim and keflex. Since that time patient states pain, redness, warmth, and swelling have reduced.  ROS- no fever/chills/nausea/vomiting  Past Medical History- Patient Active Problem List   Diagnosis Date Noted  . Hyperlipidemia 06/14/2014    Priority: Medium  . Actinic keratosis 01/07/2010    Priority: Low  . ALLERGIC RHINITIS, SEASONAL 06/03/2007    Priority: Low  . 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 COLONIC POLYPS, HX OF 03/04/2007    Priority: Low   Medications- reviewed and updated Current Outpatient Prescriptions  Medication Sig Dispense Refill  . cephALEXin (KEFLEX) 500 MG capsule Take 1 capsule (500 mg total) by mouth 3 (three) times daily. 21 capsule 0  . fexofenadine (ALLEGRA) 180 MG tablet Take 180 mg by mouth as needed.      . sulfamethoxazole-trimethoprim (BACTRIM DS) 800-160 MG per tablet Take 1 tablet by mouth 2 (two) times daily. 14 tablet 0  . ibuprofen (ADVIL,MOTRIN) 200 MG tablet Take 200 mg by mouth every 6 (six) hours as needed.       Objective: BP 128/76 mmHg  Temp(Src) 97.9 F (36.6 C)  Wt 206 lb (93.441 kg) Gen: NAD, resting comfortably CV: RRR no murmurs rubs or gallops, occasional ectopic beat Lungs: CTAB no crackles, wheeze, rhonchi Ext/skin: no edema, right forearm with indurated erythematous area about 4 cm in length, all areas of erythema have rescinded from marked line. Minimal warmth.  Neuro: intact distal sensation   Assessment/Plan:  Cellulitis R upper extremity Healing well. Needs to finish course of antibiotics. Red flags for return reviewed.  Occasional ectopic beat (asymptomatic- no palpitations, chest pain, shortness of breath but  has detected when he checks his own pulse). Discussed completing EKG at next visit as suspect PVCs. Improves with exercise.

## 2014-11-09 NOTE — Patient Instructions (Signed)
Cellulitis  Improving  Finish course of antibiotics for full 7 days  If fevers, expanding redness, please return

## 2015-05-01 ENCOUNTER — Ambulatory Visit (INDEPENDENT_AMBULATORY_CARE_PROVIDER_SITE_OTHER): Payer: BLUE CROSS/BLUE SHIELD

## 2015-05-01 DIAGNOSIS — Z23 Encounter for immunization: Secondary | ICD-10-CM | POA: Diagnosis not present

## 2015-06-11 ENCOUNTER — Other Ambulatory Visit: Payer: BC Managed Care – PPO

## 2015-06-18 ENCOUNTER — Encounter: Payer: BC Managed Care – PPO | Admitting: Family Medicine

## 2015-06-18 ENCOUNTER — Encounter: Payer: Self-pay | Admitting: Family Medicine

## 2015-06-20 ENCOUNTER — Other Ambulatory Visit: Payer: BLUE CROSS/BLUE SHIELD

## 2015-06-26 ENCOUNTER — Encounter: Payer: Self-pay | Admitting: Family Medicine

## 2015-07-31 ENCOUNTER — Encounter: Payer: Self-pay | Admitting: Internal Medicine

## 2015-09-17 ENCOUNTER — Other Ambulatory Visit (INDEPENDENT_AMBULATORY_CARE_PROVIDER_SITE_OTHER): Payer: 59

## 2015-09-17 DIAGNOSIS — R319 Hematuria, unspecified: Secondary | ICD-10-CM | POA: Diagnosis not present

## 2015-09-17 DIAGNOSIS — Z0000 Encounter for general adult medical examination without abnormal findings: Secondary | ICD-10-CM

## 2015-09-17 LAB — POCT URINALYSIS DIPSTICK
BILIRUBIN UA: NEGATIVE
Glucose, UA: NEGATIVE
KETONES UA: NEGATIVE
LEUKOCYTES UA: NEGATIVE
NITRITE UA: NEGATIVE
PH UA: 7
PROTEIN UA: NEGATIVE
Spec Grav, UA: 1.02
Urobilinogen, UA: 0.2

## 2015-09-17 LAB — LIPID PANEL
CHOL/HDL RATIO: 4
Cholesterol: 222 mg/dL — ABNORMAL HIGH (ref 0–200)
HDL: 49.8 mg/dL (ref >39.00)
LDL Cholesterol: 151 mg/dL — ABNORMAL HIGH (ref 0–99)
NONHDL: 172.07
Triglycerides: 105 mg/dL (ref 0.0–149.0)
VLDL: 21 mg/dL (ref 0.0–40.0)

## 2015-09-17 LAB — MICROALBUMIN / CREATININE URINE RATIO
CREATININE, U: 210.7 mg/dL
MICROALB UR: 0.7 mg/dL (ref 0.0–1.9)
MICROALB/CREAT RATIO: 0.3 mg/g (ref 0.0–30.0)

## 2015-09-17 LAB — CBC WITH DIFFERENTIAL/PLATELET
BASOS ABS: 0.1 10*3/uL (ref 0.0–0.1)
Basophils Relative: 0.7 % (ref 0.0–3.0)
EOS ABS: 0.2 10*3/uL (ref 0.0–0.7)
Eosinophils Relative: 3.1 % (ref 0.0–5.0)
HEMATOCRIT: 47.5 % (ref 39.0–52.0)
Hemoglobin: 15.8 g/dL (ref 13.0–17.0)
LYMPHS PCT: 30.6 % (ref 12.0–46.0)
Lymphs Abs: 2.2 10*3/uL (ref 0.7–4.0)
MCHC: 33.2 g/dL (ref 30.0–36.0)
MCV: 93.9 fl (ref 78.0–100.0)
MONOS PCT: 9.1 % (ref 3.0–12.0)
Monocytes Absolute: 0.7 10*3/uL (ref 0.1–1.0)
NEUTROS PCT: 56.5 % (ref 43.0–77.0)
Neutro Abs: 4.1 10*3/uL (ref 1.4–7.7)
PLATELETS: 311 10*3/uL (ref 150.0–400.0)
RBC: 5.06 Mil/uL (ref 4.22–5.81)
RDW: 13.5 % (ref 11.5–15.5)
WBC: 7.3 10*3/uL (ref 4.0–10.5)

## 2015-09-17 LAB — BASIC METABOLIC PANEL
BUN: 20 mg/dL (ref 6–23)
CALCIUM: 9.3 mg/dL (ref 8.4–10.5)
CO2: 25 mEq/L (ref 19–32)
CREATININE: 1.03 mg/dL (ref 0.40–1.50)
Chloride: 105 mEq/L (ref 96–112)
GFR: 77.34 mL/min (ref >60.00)
Glucose, Bld: 118 mg/dL — ABNORMAL HIGH (ref 70–99)
Potassium: 4.7 mEq/L (ref 3.5–5.1)
SODIUM: 142 meq/L (ref 135–145)

## 2015-09-17 LAB — HEPATIC FUNCTION PANEL
ALBUMIN: 4.3 g/dL (ref 3.5–5.2)
ALK PHOS: 49 U/L (ref 39–117)
ALT: 24 U/L (ref 0–53)
AST: 21 U/L (ref 0–37)
BILIRUBIN DIRECT: 0.1 mg/dL (ref 0.0–0.3)
Total Bilirubin: 0.8 mg/dL (ref 0.2–1.2)
Total Protein: 6.9 g/dL (ref 6.0–8.3)

## 2015-09-17 LAB — TSH: TSH: 1.3 u[IU]/mL (ref 0.35–4.50)

## 2015-09-17 LAB — PSA: PSA: 1.89 ng/mL (ref 0.10–4.00)

## 2015-09-24 ENCOUNTER — Ambulatory Visit (INDEPENDENT_AMBULATORY_CARE_PROVIDER_SITE_OTHER): Payer: 59 | Admitting: Family Medicine

## 2015-09-24 ENCOUNTER — Encounter: Payer: Self-pay | Admitting: Family Medicine

## 2015-09-24 VITALS — BP 132/82 | HR 64 | Temp 98.3°F | Ht 69.0 in | Wt 205.0 lb

## 2015-09-24 DIAGNOSIS — R8290 Unspecified abnormal findings in urine: Secondary | ICD-10-CM

## 2015-09-24 DIAGNOSIS — Z0000 Encounter for general adult medical examination without abnormal findings: Secondary | ICD-10-CM | POA: Diagnosis not present

## 2015-09-24 DIAGNOSIS — E785 Hyperlipidemia, unspecified: Secondary | ICD-10-CM

## 2015-09-24 DIAGNOSIS — R739 Hyperglycemia, unspecified: Secondary | ICD-10-CM | POA: Diagnosis not present

## 2015-09-24 DIAGNOSIS — R319 Hematuria, unspecified: Secondary | ICD-10-CM

## 2015-09-24 LAB — URINALYSIS, MICROSCOPIC ONLY

## 2015-09-24 LAB — POCT GLYCOSYLATED HEMOGLOBIN (HGB A1C): Hemoglobin A1C: 5.2

## 2015-09-24 NOTE — Assessment & Plan Note (Signed)
S: no rx at present. 11.8% 10 year risk A/P: Not willing to take statin for primary prevention despite risks

## 2015-09-24 NOTE — Patient Instructions (Addendum)
You are going to work to get your weight back down to 190s. We will recalculate your 10 year risk of heart attack or stroke. Was elevated above 10% I often use to advise cholesterol medicine. You opted for primary prevention reasons not to take this at present or in future- if you change your mind please let me know. Another controversial medicine to reduce cardiac risk is aspirin- reasonable to take 81mg  over 10% risk if you would like.   Stop by lab to drop off urine- doubt actual blood in urine  At risk for diabetes with a1c of  Lab Results  Component Value Date   HGBA1C 5.2 09/24/2015  no diabetes <5.7 5.7-6.4 at risk for diabetes 6.5 or greater= diabetes  Working on getting weight back into 190s very reasonable

## 2015-09-24 NOTE — Progress Notes (Signed)
Randy Reddish, MD Phone: [phone]  Subjective:  Patient presents today for their annual physical. Chief complaint-noted.   See problem oriented charting- ROS- full  review of systems was completed and negative except for: No chest pain or shortness of breath. No headache or blurry vision.   The following were reviewed and entered/updated in epic: No past medical history on file. Patient Active Problem List   Diagnosis Date Noted  . Hyperlipidemia 06/14/2014    Priority: Medium  . Actinic keratosis 01/07/2010    Priority: Low  . ALLERGIC RHINITIS, SEASONAL 06/03/2007    Priority: Low  . 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 COLONIC POLYPS, HX OF 03/04/2007    Priority: Low   Past Surgical History  Procedure Laterality Date  . Colonoscopy  2006; 03/11/2011    5 mm polyp removed; 75mm cecal polyp and diverticulosis  . Knee arthroscopy      left    Family History  Problem Relation Age of Onset  . Tuberculosis Mother   . Emphysema Maternal Grandfather     smoker    Medications- reviewed and updated Current Outpatient Prescriptions  Medication Sig Dispense Refill  . fexofenadine (ALLEGRA) 180 MG tablet Take 180 mg by mouth as needed. Reported on 09/24/2015    . ibuprofen (ADVIL,MOTRIN) 200 MG tablet Take 200 mg by mouth every 6 (six) hours as needed. Reported on 09/24/2015     No current facility-administered medications for this visit.    Allergies-reviewed and updated No Known Allergies  Social History   Social History  . Marital Status: Married    Spouse Name: N/A  . Number of Children: N/A  . Years of Education: N/A   Social History Main Topics  . Smoking status: Never Smoker   . Smokeless tobacco: None  . Alcohol Use: 7.0 oz/week    14 drink(s) per week  . Drug Use: No  . Sexual Activity: Not Asked   Other Topics Concern  . None   Social History Narrative   Divorced twice. 2 adult children and daughter (2003). 1 granddaughter.       Adopted by grandparents.      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 Oropharynx normal Neck: no thyromegaly CV: RRR no murmurs rubs or gallops Lungs: CTAB no crackles, wheeze, rhonchi Abdomen: soft/nontender/nondistended/normal bowel sounds. No rebound or guarding.  Rectal: normal tone, slightly enlarged prostate, no masses or tenderness Ext: no edema Skin: warm, dry Neuro: grossly normal, moves all extremities, PERRLA  Assessment/Plan:  65 y.o. male presenting for annual physical.  Health Maintenance counseling: 1. Anticipatory guidance: Patient counseled regarding regular dental exams, eye exams, wearing seatbelts.  2. Risk factor reduction:  Advised patient of need for regular exercise (walks 4-9 miles a day) and diet rich and fruits and vegetables to reduce risk of heart attack and stroke. Goal weight lower 190s. Needs to work on portion size per him.  3. Immunizations/screenings/ancillary studies Health Maintenance Due  Topic Date Due  . Hepatitis C Screening - declined 11/30/1950  . HIV Screening - before got married 12/15/1966   4. Prostate cancer screening- low risk based off PSA  and rectal exam  Lab Results  Component Value Date   PSA 1.89 09/17/2015   PSA 1.24 04/04/2013   PSA 4.46* 02/15/2013   5. Colon cancer screening - history of colon polyps 03/13/2011 with 5 year follow up. <0.35 pe ryear- repeat 1 year 6. Skin cancer screening- Dr. Redmond Pulling at [facility] over a year ago   Allergies stable on as needed allegra Hyperglycemia- 118 but had bene 93 on home check same day. Check a1c- possible false elevation in fasting?  Lab Results  Component Value Date   HGBA1C 5.2 09/24/2015   Hematuria- Repeat urine with prior trace  intact blood. Microalbumin was ordered in error by lab and patient should not be charged for that or for repeat today  Hyperlipidemia S: no rx at present. 11.8% 10 year risk A/P: Not willing to take statin for primary prevention despite risks     Return in about 1 year (around 09/23/2016) for physical. Return precautions advised.   Orders Placed This Encounter  Procedures  . Urine Microscopic  . POC HgB A1c

## 2016-04-01 ENCOUNTER — Encounter: Payer: Self-pay | Admitting: Internal Medicine

## 2016-04-08 ENCOUNTER — Encounter: Payer: Self-pay | Admitting: Internal Medicine

## 2016-05-15 ENCOUNTER — Encounter: Payer: Self-pay | Admitting: Internal Medicine

## 2016-05-15 ENCOUNTER — Ambulatory Visit (AMBULATORY_SURGERY_CENTER): Payer: Self-pay

## 2016-05-15 VITALS — Ht 70.0 in | Wt 203.2 lb

## 2016-05-15 DIAGNOSIS — Z860100 Personal history of colon polyps, unspecified: Secondary | ICD-10-CM

## 2016-05-15 NOTE — Progress Notes (Signed)
No home oxygen No diet meds No past problems with anesthesia No allergies to eggs or soy  Declined emmi 

## 2016-05-29 ENCOUNTER — Ambulatory Visit (AMBULATORY_SURGERY_CENTER): Payer: 59 | Admitting: Internal Medicine

## 2016-05-29 ENCOUNTER — Encounter: Payer: Self-pay | Admitting: Internal Medicine

## 2016-05-29 VITALS — BP 116/78 | HR 52 | Temp 99.3°F | Resp 10 | Ht 70.0 in | Wt 203.0 lb

## 2016-05-29 DIAGNOSIS — D123 Benign neoplasm of transverse colon: Secondary | ICD-10-CM

## 2016-05-29 DIAGNOSIS — D122 Benign neoplasm of ascending colon: Secondary | ICD-10-CM | POA: Diagnosis not present

## 2016-05-29 DIAGNOSIS — D120 Benign neoplasm of cecum: Secondary | ICD-10-CM | POA: Diagnosis not present

## 2016-05-29 DIAGNOSIS — Z86010 Personal history of colonic polyps: Secondary | ICD-10-CM

## 2016-05-29 MED ORDER — SODIUM CHLORIDE 0.9 % IV SOLN: 500.0000 mL | INTRAVENOUS | Status: DC

## 2016-05-29 NOTE — Progress Notes (Signed)
A and O x3. Report to RN. Tolerated MAC anesthesia well. 

## 2016-05-29 NOTE — Patient Instructions (Addendum)
   I found and removed 2 small polyps that look benign. I will let you know pathology results and when to have another routine colonoscopy by mail.  I appreciate the opportunity to care for you. Gatha Mayer, MD, FACG  YOU HAD AN ENDOSCOPIC PROCEDURE TODAY AT [facility]:   Refer to the procedure report that was given to you for any specific questions about what was found during the examination.  If the procedure report does not answer your questions, please call your gastroenterologist to clarify.  If you requested that your care partner not be given the details of your procedure findings, then the procedure report has been included in a sealed envelope for you to review at your convenience later.  YOU SHOULD EXPECT: Some feelings of bloating in the abdomen. Passage of more gas than usual.  Walking can help get rid of the air that was put into your GI tract during the procedure and reduce the bloating. If you had a lower endoscopy (such as a colonoscopy or flexible sigmoidoscopy) you may notice spotting of blood in your stool or on the toilet paper. If you underwent a bowel prep for your procedure, you may not have a normal bowel movement for a few days.  Please Note:  You might notice some irritation and congestion in your nose or some drainage.  This is from the oxygen used during your procedure.  There is no need for concern and it should clear up in a day or so.  SYMPTOMS TO REPORT IMMEDIATELY:   Following lower endoscopy (colonoscopy or flexible sigmoidoscopy):  Excessive amounts of blood in the stool  Significant tenderness or worsening of abdominal pains  Swelling of the abdomen that is new, acute  Fever of 100F or higher   For urgent or emergent issues, a gastroenterologist can be reached at any hour by calling [phone].   DIET:  We do recommend a small meal at first, but then you may proceed to your regular diet.  Drink plenty of fluids but you  should avoid alcoholic beverages for 24 hours.  ACTIVITY:  You should plan to take it easy for the rest of today and you should NOT DRIVE or use heavy machinery until tomorrow (because of the sedation medicines used during the test).    FOLLOW UP: Our staff will call the number listed on your records the next business day following your procedure to check on you and address any questions or concerns that you may have regarding the information given to you following your procedure. If we do not reach you, we will leave a message.  However, if you are feeling well and you are not experiencing any problems, there is no need to return our call.  We will assume that you have returned to your regular daily activities without incident.  If any biopsies were taken you will be contacted by phone or by letter within the next 1-3 weeks.  Please call us at [phone] if you have not heard about the biopsies in 3 weeks.    SIGNATURES/CONFIDENTIALITY: You and/or your care partner have signed paperwork which will be entered into your electronic medical record.  These signatures attest to the fact that that the information above on your After Visit Summary has been reviewed and is understood.  Full responsibility of the confidentiality of this discharge information lies with you and/or your care-partner.

## 2016-05-29 NOTE — Op Note (Signed)
[place] Patient Name: Randy Rogers Procedure Date: 05/29/2016 10:50 AM MRN: [id number] Endoscopist: Gatha Mayer , MD Age: 65 Referring MD:  Date of Birth: 1950-12-10 Gender: Male Account #: [id number] Procedure:                Colonoscopy Indications:              Surveillance: Personal history of adenomatous                            polyps on last colonoscopy 5 years ago Medicines:                Propofol per Anesthesia, Monitored Anesthesia Care Procedure:                Pre-Anesthesia Assessment:                           - Prior to the procedure, a History and Physical                            was performed, and patient medications and                            allergies were reviewed. The patient's tolerance of                            previous anesthesia was also reviewed. The risks                            and benefits of the procedure and the sedation                            options and risks were discussed with the patient.                            All questions were answered, and informed consent                            was obtained. Prior Anticoagulants: The patient                            last took ibuprofen 1 day prior to the procedure.                            ASA Grade Assessment: II - A patient with mild                            systemic disease. After reviewing the risks and                            benefits, the patient was deemed in satisfactory                            condition to undergo the procedure.  After obtaining informed consent, the colonoscope                            was passed under direct vision. Throughout the                            procedure, the patient's blood pressure, pulse, and                            oxygen saturations were monitored continuously. The                            Model CF-HQ190L [id number]) scope was introduced                            through the  anus and advanced to the the cecum,                            identified by appendiceal orifice and ileocecal                            valve. The colonoscopy was performed without                            difficulty. The patient tolerated the procedure                            well. The quality of the bowel preparation was                            excellent. The bowel preparation used was Miralax.                            The ileocecal valve, appendiceal orifice, and                            rectum were photographed. Scope In: 11:00:15 AM Scope Out: 11:13:27 AM Scope Withdrawal Time: 0 hours 10 minutes 36 seconds  Total Procedure Duration: 0 hours 13 minutes 12 seconds  Findings:                 The perianal and digital rectal examinations were                            normal. Pertinent negatives include normal prostate                            (size, shape, and consistency).                           Two sessile polyps were found in the transverse                            colon and ascending colon. The polyps were 3 to 5  mm in size. These polyps were removed with a cold                            snare. Resection and retrieval were complete.                            Verification of patient identification for the                            specimen was done. Estimated blood loss was minimal.                           The exam was otherwise without abnormality on                            direct and retroflexion views. Complications:            No immediate complications. Estimated blood loss:                            Minimal. Estimated Blood Loss:     Estimated blood loss was minimal. Impression:               - Two 3 to 5 mm polyps in the transverse colon and                            in the ascending colon, removed with a cold snare.                            Resected and retrieved.                           - The examination was  otherwise normal on direct                            and retroflexion views.                           - Personal history of colonic polyps. 2006 and 2012                            (single adenomas) Recommendation:           - Patient has a contact number available for                            emergencies. The signs and symptoms of potential                            delayed complications were discussed with the                            patient. Return to normal activities tomorrow.  Written discharge instructions were provided to the                            patient.                           - Resume previous diet.                           - Continue present medications.                           - Repeat colonoscopy is recommended for                            surveillance. The colonoscopy date will be                            determined after pathology results from today's                            exam become available for review. Gatha Mayer, MD 05/29/2016 11:20:05 AM This report has been signed electronically.

## 2016-05-30 ENCOUNTER — Telehealth: Payer: Self-pay | Admitting: *Deleted

## 2016-05-30 NOTE — Telephone Encounter (Signed)
  Follow up Call-  Call back number 05/29/2016  Post procedure Call Back phone  # [phone]  Permission to leave phone message Yes  Some recent data might be hidden     Patient questions:  Do you have a fever, pain , or abdominal swelling? No. Pain Score  0 *  Have you tolerated food without any problems? Yes.    Have you been able to return to your normal activities? Yes.    Do you have any questions about your discharge instructions: Diet   No. Medications  No. Follow up visit  No.  Do you have questions or concerns about your Care? No.  Actions: * If pain score is 4 or above: No action needed, pain <4.

## 2016-06-11 ENCOUNTER — Encounter: Payer: Self-pay | Admitting: Internal Medicine

## 2016-06-11 DIAGNOSIS — Z86010 Personal history of colonic polyps: Secondary | ICD-10-CM

## 2016-06-11 NOTE — Progress Notes (Signed)
Single adenoma Recall 2022

## 2017-04-16 ENCOUNTER — Ambulatory Visit (INDEPENDENT_AMBULATORY_CARE_PROVIDER_SITE_OTHER): Payer: Medicare HMO | Admitting: Family Medicine

## 2017-04-16 ENCOUNTER — Encounter: Payer: Self-pay | Admitting: Family Medicine

## 2017-04-16 VITALS — BP 134/84 | HR 82 | Temp 98.1°F | Ht 69.5 in | Wt 202.0 lb

## 2017-04-16 DIAGNOSIS — R55 Syncope and collapse: Secondary | ICD-10-CM | POA: Insufficient documentation

## 2017-04-16 DIAGNOSIS — E785 Hyperlipidemia, unspecified: Secondary | ICD-10-CM

## 2017-04-16 DIAGNOSIS — J301 Allergic rhinitis due to pollen: Secondary | ICD-10-CM | POA: Diagnosis not present

## 2017-04-16 DIAGNOSIS — S0990XA Unspecified injury of head, initial encounter: Secondary | ICD-10-CM

## 2017-04-16 LAB — COMPREHENSIVE METABOLIC PANEL
ALT: 17 U/L (ref 0–53)
AST: 20 U/L (ref 0–37)
Albumin: 4.2 g/dL (ref 3.5–5.2)
Alkaline Phosphatase: 48 U/L (ref 39–117)
BUN: 16 mg/dL (ref 6–23)
CO2: 29 meq/L (ref 19–32)
CREATININE: 0.95 mg/dL (ref 0.40–1.50)
Calcium: 9.3 mg/dL (ref 8.4–10.5)
Chloride: 103 mEq/L (ref 96–112)
GFR: 84.48 mL/min (ref >60.00)
GLUCOSE: 100 mg/dL — AB (ref 70–99)
Potassium: 4.1 mEq/L (ref 3.5–5.1)
SODIUM: 138 meq/L (ref 135–145)
Total Bilirubin: 0.8 mg/dL (ref 0.2–1.2)
Total Protein: 6.6 g/dL (ref 6.0–8.3)

## 2017-04-16 LAB — CBC
HCT: 46.3 % (ref 39.0–52.0)
Hemoglobin: 15.6 g/dL (ref 13.0–17.0)
MCHC: 33.7 g/dL (ref 30.0–36.0)
MCV: 94.1 fl (ref 78.0–100.0)
PLATELETS: 295 10*3/uL (ref 150.0–400.0)
RBC: 4.92 Mil/uL (ref 4.22–5.81)
RDW: 13.1 % (ref 11.5–15.5)
WBC: 6.3 10*3/uL (ref 4.0–10.5)

## 2017-04-16 LAB — LIPID PANEL
CHOL/HDL RATIO: 4
Cholesterol: 172 mg/dL (ref 0–200)
HDL: 48 mg/dL (ref >39.00)
LDL Cholesterol: 102 mg/dL — ABNORMAL HIGH (ref 0–99)
NONHDL: 123.93
Triglycerides: 108 mg/dL (ref 0.0–149.0)
VLDL: 21.6 mg/dL (ref 0.0–40.0)

## 2017-04-16 LAB — TSH: TSH: 1.8 u[IU]/mL (ref 0.35–4.50)

## 2017-04-16 MED ORDER — FLUTICASONE PROPIONATE 50 MCG/ACT NA SUSP: 2.0000 | Freq: Every day | NASAL | 3 refills | Status: AC

## 2017-04-16 NOTE — Progress Notes (Addendum)
Subjective:  Randy Rogers is a 66 y.o. year old very pleasant male patient who presents for/with See problem oriented charting Review of Systems  Constitutional: Negative for chills, fever and weight loss.  HENT: Positive for congestion and tinnitus. Negative for hearing loss.   Eyes: Negative for blurred vision and double vision.  Respiratory: Negative for cough, hemoptysis, sputum production and shortness of breath.   Cardiovascular: Negative for chest pain, palpitations, orthopnea and leg swelling.  Gastrointestinal: Negative for heartburn, nausea and vomiting.  Genitourinary: Negative for dysuria and urgency.  Musculoskeletal: Negative for myalgias and neck pain.  Skin: Negative for itching and rash.  Neurological: Positive for loss of consciousness. Negative for dizziness, sensory change, speech change, focal weakness, seizures and headaches.  Endo/Heme/Allergies: Negative for polydipsia. Does not bruise/bleed easily.  Psychiatric/Behavioral: Negative for hallucinations and substance abuse.   Past Medical History-  Patient Active Problem List   Diagnosis Date Noted  . Hyperlipidemia 06/14/2014    Priority: Medium  . Actinic keratosis 01/07/2010    Priority: Low  . ALLERGIC RHINITIS, SEASONAL 06/03/2007    Priority: Low  . 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 History of colonic polyps 03/04/2007    Priority: Low   Past Surgical History:  Procedure Laterality Date  . COLONOSCOPY  2006; 03/11/2011   5 mm polyp removed; 86mm cecal polyp and diverticulosis  . KNEE ARTHROSCOPY     left   Family History  Problem Relation Age of Onset  . Tuberculosis Mother   . Emphysema Maternal Grandfather        smoker  no reported cardiac history. No reported syncopal episodes in family.   Social History   Social History  . Marital status: Married    Spouse name: N/A  . Number of children: N/A  . Years of education: N/A   Social History Main Topics  . Smoking status: Never  Smoker  . Smokeless tobacco: Never Used  . Alcohol use 7.0 oz/week    14 Standard drinks or equivalent per week  . Drug use: No  . Sexual activity: Not Asked   Other Topics Concern  . None   Social History Narrative   Divorced twice. 2 adult children and daughter (2003). 1 granddaughter.       Adopted by grandparents.      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 fexofenadine (ALLEGRA) 180 MG tablet Take 180 mg by mouth as needed. Reported on 09/24/2015    . ibuprofen (ADVIL,MOTRIN) 200 MG tablet Take 200 mg by mouth every 6 (six) hours as needed. Reported on 09/24/2015    . fluticasone (FLONASE) 50 MCG/ACT nasal spray Place 2 sprays into both nostrils daily. 16 g 3   No current facility-administered medications for this visit.     Objective: BP 134/84   Pulse 82   Temp 98.1 F (36.7 C) (Oral)   Ht 5' 9.5" (1.765 m)   Wt 202 lb (91.6 kg)   SpO2 96%   BMI 29.40 kg/m  Gen: NAD, resting comfortably Mucous membranes are moist. Oropharynx normal CV: RRR with occasional ectopic beat no murmurs rubs or gallops Lungs: CTAB no crackles, wheeze, rhonchi Abdomen: soft/nontender/nondistended/normal bowel sounds. No rebound or guarding.  Ext: no edema Skin: warm, dry Neuro: CN II-XII intact, sensation and reflexes normal  throughout, 5/5 muscle strength in bilateral upper and lower extremities. Normal finger to nose. Normal rapid alternating movements. No pronator drift. Normal romberg. Normal gait.   EKG: sinus rhythm and rate of 70 with 2 PACs noted, normal axis, normal intervals, no hypertrophy, no st or t wave changes. No prior to compare.   Assessment/Plan:  We had planned for a physical today but with his acute concern of syncope we decided to reschedule his physical to a later date.  Syncope, unspecified syncope type - Plan: EKG 12-Lead,  CBC, Comprehensive metabolic panel, Lipid panel, TSH, MR Brain W Wo Contrast, ECHOCARDIOGRAM COMPLETE Traumatic injury of head- Plan: MR Brain W Wo Contrast S:  Patient states he has had a history of syncope for at least 10 years. He states first happened 10 years ago while he was in [place]. Then happened again four and a half years ago.  He states he was worked up the first 2 times and it was determined to be vasovagal in etiology. He has also had some periods where he felt like he could pass out but if he were to lay down and raise his legs up and rest-the symptoms would resolve. Eating or drinking also helps prevent episodes if he feels a precursor  He states each episode is usually triggered after some level of stress on his body. Most of the time this happens he is hungry or thirsty and hasn't eaten (skipping meals is a big trigger). Often overheated temperature wise. Has happened after sex or after exercise. Still walking 4-9 miles daily- and has not had any issues with this regimen  Recurred 2-3 weeks ago. After sex on a day when he had not eaten much and not had much to drink this happened. Before hand he feels shaky, cold, and then he notes an image of a video game playing in his head directly before he passed out. The image also happened before the last event 4-5 years ago. His daughter is an Administrator, [employer] in [place] and is concerned about this "aura" like syndrome before syncope- with video game playing in his head. Patient states he intentionally precipitated this last event (did not lay down to rest and prop legs up because he wanted to see what would happen). He was standing up when it occurred and fell and hit his head- though no major laceration or injury. No medications have been tried in relation to syncopal episodes.  With episodes denies sweatiness or warm feeling.  A/P: I still believe it is very likely these are vasovagal episodes as he has been told during last 2 episodes- I did not advise  removing his driving for 6 months as a result as appears vasovagal instead of unexplained. Still think reasonable to pursue workup for more reassurance with this decision.  Reassuring neurological exam. Patient is very concerned about the aura like symptoms-also did hit his head with most recent fall so we will get neuro imaging with MRI brain. He declined a neurological referral-doubt seizure related, doubt hypoglycemia related as he recovers quickly without eating or intervention. EKG shows PACs but no clear dangerous rhythm. With episodes being so intermittent I'm not sure cardiac monitor would be helpful. Will get echocardiogram. Will update labs. We are going to follow up for a physical within 2 months-would consider stress testing depending on results (untreated HLD and age as cardiac risk factors)  Hyperlipidemia- Plan: CBC, Comprehensive metabolic panel, Lipid panel, TSH HLD- not interested in statin for primary prevention. Will advise aspirin  81 mg at future visit if no evidence of bleeding on MRI  Non-seasonal allergic rhinitis due to pollen S: Patient returns from [place] about a month ago in a few days within returning- he developed several issues including sinus fullness, ear fullness, pain in ears at times or behind eyes at times, intermittent tinnitus without hearing loss, slight unsteadiness. Zyrtec and Allegra helped him with some symptoms but seemed to worsen his unsteadiness. He has not tried Flonase or similar A/P: Possible allergic symptoms. We'll trial Flonase as this should not cause unsteadiness  No Follow-up on file.  Orders Placed This Encounter  Procedures  . MR Brain W Wo Contrast    Standing Status:   Future    Standing Expiration Date:   06/16/2018    Order Specific Question:   If indicated for the ordered procedure, I authorize the administration of contrast media per Radiology protocol    Answer:   Yes    Order Specific Question:   What is the patient's sedation  requirement?    Answer:   No Sedation    Order Specific Question:   Does the patient have a pacemaker or implanted devices?    Answer:   No    Order Specific Question:   Radiology Contrast Protocol - do NOT remove file path    Answer:   [email or web address]    Order Specific Question:   Preferred imaging location?    Answer:   [facility] (table limit-350 lbs)  . CBC    [place]  . Comprehensive metabolic panel    [place]    Order Specific Question:   Has the patient fasted?    Answer:   No  . Lipid panel    [place]    Order Specific Question:   Has the patient fasted?    Answer:   No  . TSH    [place]  . EKG 12-Lead  . ECHOCARDIOGRAM COMPLETE    Standing Status:   Future    Standing Expiration Date:   07/17/2018    Order Specific Question:   Where should this test be performed    Answer:   [facility])    Order Specific Question:   Does the patient weigh less than or greater than 250 lbs?    Answer:   Patient weighs less than 250 lbs    Order Specific Question:   Complete or Limited study?    Answer:   Complete    Order Specific Question:   With Image Enhancing Agent or without Image Enhancing Agent?    Answer:   With Image Enhancing Agent    Order Specific Question:   Reason for exam-Echo    Answer:   Syncope  780.2 / R55    Meds ordered this encounter  Medications  . fluticasone (FLONASE) 50 MCG/ACT nasal spray    Sig: Place 2 sprays into both nostrils daily.    Dispense:  16 g    Refill:  3    Return precautions advised.  Garret Reddish, MD

## 2017-04-16 NOTE — Assessment & Plan Note (Signed)
Traumatic injury of head- Plan: MR Brain W Wo Contrast S:  Patient states he has had a history of syncope for at least 10 years. He states first happened 10 years ago while he was in [place]. Then happened again four and a half years ago.  He states he was worked up the first 2 times and it was determined to be vasovagal in etiology. He has also had some periods where he felt like he could pass out but if he were to lay down and raise his legs up and rest-the symptoms would resolve. Eating or drinking also helps prevent episodes if he feels a precursor  He states each episode is usually triggered after some level of stress on his body. Most of the time this happens he is hungry or thirsty and hasn't eaten (skipping meals is a big trigger). Often overheated temperature wise. Has happened after sex or after exercise. Still walking 4-9 miles daily- and has not had any issues with this regimen  Recurred 2-3 weeks ago. After sex on a day when he had not eaten much and not had much to drink this happened. Before hand he feels shaky, cold, and then he notes an image of a video game playing in his head directly before he passed out. The image also happened before the last event 4-5 years ago. His daughter is an Administrator, [employer] in [place] and is concerned about this "aura" like syndrome before syncope- with video game playing in his head. Patient states he intentionally precipitated this last event (did not lay down to rest and prop legs up because he wanted to see what would happen). He was standing up when it occurred and fell and hit his head- though no major laceration or injury. No medications have been tried in relation to syncopal episodes.  With episodes denies sweatiness or warm feeling.  A/P: I still believe it is very likely these are vasovagal episodes as he has been told during last 2 episodes- I did not advise removing his driving for 6 months as a result as appears vasovagal instead of unexplained. Still think  reasonable to pursue workup for more reassurance with this decision.  Reassuring neurological exam. Patient is very concerned about the aura like symptoms-also did hit his head with most recent fall so we will get neuro imaging with MRI brain. He declined a neurological referral-doubt seizure related, doubt hypoglycemia related as he recovers quickly without eating or intervention. EKG shows PACs but no clear dangerous rhythm. With episodes being so intermittent I'm not sure cardiac monitor would be helpful. Will get echocardiogram. Will update labs. We are going to follow up for a physical within 2 months-would consider stress testing depending on results

## 2017-04-16 NOTE — Patient Instructions (Addendum)
Recheck BP and neuro exam  Please stop by lab before you go  We will call you within a week or two about your referral for echocardiogram and MRI brain. If you do not hear within 3 weeks, give [place] a call.   Depending on echo results consider stress test. Happy to also consider any other testing daughter or you are interested in but I think this is a good starting place  Trial flonase for allergies- reschedule physical after above workup complete- or perhaps just go ahead and schedule 2 months from now. MRI likely to take the longest of the tests

## 2017-04-27 ENCOUNTER — Other Ambulatory Visit: Payer: Self-pay

## 2017-04-27 ENCOUNTER — Ambulatory Visit (HOSPITAL_COMMUNITY): Payer: Medicare HMO | Attending: Cardiology

## 2017-04-27 DIAGNOSIS — R55 Syncope and collapse: Secondary | ICD-10-CM | POA: Diagnosis not present

## 2017-05-06 ENCOUNTER — Ambulatory Visit
Admission: RE | Admit: 2017-05-06 | Discharge: 2017-05-06 | Disposition: A | Discharge status: Home / Self Care | Payer: Medicare HMO | Source: Ambulatory Visit | Attending: Family Medicine | Admitting: Family Medicine

## 2017-05-06 DIAGNOSIS — S0990XA Unspecified injury of head, initial encounter: Secondary | ICD-10-CM

## 2017-05-06 DIAGNOSIS — R55 Syncope and collapse: Secondary | ICD-10-CM

## 2017-05-06 MED ORDER — GADOBENATE DIMEGLUMINE 529 MG/ML IV SOLN
18.0000 mL | Freq: Once | INTRAVENOUS | Status: AC | PRN
  Administered 2017-05-06: 18 mL via INTRAVENOUS

## 2017-06-16 ENCOUNTER — Ambulatory Visit: Payer: Medicare HMO | Admitting: Family Medicine

## 2017-10-06 ENCOUNTER — Ambulatory Visit: Payer: Self-pay | Admitting: *Deleted

## 2017-10-06 DIAGNOSIS — R000 Tachycardia, unspecified: Secondary | ICD-10-CM | POA: Diagnosis not present

## 2017-10-06 NOTE — Telephone Encounter (Signed)
Pt  Called  Speaking in  Complete   sentences    Stating  He  Was  Out  Walking  His  Dog   When   He   Felt  His  Pulse  And  It  Was  200   He  States  He  Has  Never   Had  Tachy cardia   Before  911    Activated     And  conference   In  With   Patient   pts  Daughter  Is   Present      Reason for Disposition . Sounds like a life-threatening emergency to the triager  Answer Assessment - Initial Assessment Questions 1. ACUTE COMPLAINT: "What is the main problem?" "Tell me what happened?"     Tachycardia    Rate 200  He  States    2. AIRWAY: "Is he / she breathing?" (Yes, No, Unknown)      yes 3. BREATHING: "Is there difficulty breathing?" (Yes, No, Unknown)      no 4. CIRCULATION: "Is there any bleeding?" (Yes, No, Unknown)       unknown 5. CONSCIOUS: "Is he/she awake, alert, and responding to you?" (Yes, No, Unknown)        Awake  Protocols used: 911 [facility]

## 2017-10-06 NOTE — Telephone Encounter (Signed)
Thanks for helping patient Randy Rogers- we will look out for the ER report

## 2018-06-02 ENCOUNTER — Ambulatory Visit (INDEPENDENT_AMBULATORY_CARE_PROVIDER_SITE_OTHER): Payer: Medicare HMO

## 2018-06-02 VITALS — BP 138/76 | HR 66 | Ht 69.5 in | Wt 190.2 lb

## 2018-06-02 DIAGNOSIS — Z0000 Encounter for general adult medical examination without abnormal findings: Secondary | ICD-10-CM

## 2018-06-02 NOTE — Patient Instructions (Signed)
Randy Rogers , Thank you for taking time to come for your Medicare Wellness Visit. I appreciate your ongoing commitment to your health goals. Please review the following plan we discussed and let me know if I can assist you in the future.   These are the goals we discussed: Goals    . Exercise 150 min/wk Moderate Activity     Continue to stay active with travel and walking       This is a list of the screening recommended for you and due dates:  Health Maintenance  Topic Date Due  .  Hepatitis C: One time screening is recommended by Center for Disease Control  (CDC) for  adults born from 1 through 1964.   09/21/[birth year]109*  . Pneumonia vaccines (2 of 2 - PCV13) 12/31/2018  . Colon Cancer Screening  05/29/2021  . Tetanus Vaccine  11/06/2024  . Flu Shot  Completed  *Topic was postponed. The date shown is not the original due date.   Preventive Care for Adults  A healthy lifestyle and preventive care can promote health and wellness. Preventive health guidelines for adults include the following key practices.  . A routine yearly physical is a good way to check with your health care provider about your health and preventive screening. It is a chance to share any concerns and updates on your health and to receive a thorough exam.  . Visit your dentist for a routine exam and preventive care every 6 months. Brush your teeth twice a day and floss once a day. Good oral hygiene prevents tooth decay and gum disease.  . The frequency of eye exams is based on your age, health, family medical history, use  of contact lenses, and other factors. Follow your health care provider's recommendations for frequency of eye exams.  . Eat a healthy diet. Foods like vegetables, fruits, whole grains, low-fat dairy products, and lean protein foods contain the nutrients you need without too many calories. Decrease your intake of foods high in solid fats, added sugars, and salt. Eat the right amount of calories for  you. Get information about a proper diet from your health care provider, if necessary.  . Regular physical exercise is one of the most important things you can do for your health. Most adults should get at least 150 minutes of moderate-intensity exercise (any activity that increases your heart rate and causes you to sweat) each week. In addition, most adults need muscle-strengthening exercises on 2 or more days a week.  Silver Sneakers may be a benefit available to you. To determine eligibility, you may visit the website: www.silversneakers.com or contact program at 407 133 3169 Mon-Fri between 8AM-8PM.   . Maintain a healthy weight. The body mass index (BMI) is a screening tool to identify possible weight problems. It provides an estimate of body fat based on height and weight. Your health care provider can find your BMI and can help you achieve or maintain a healthy weight.   For adults 20 years and older: ? A BMI below 18.5 is considered underweight. ? A BMI of 18.5 to 24.9 is normal. ? A BMI of 25 to 29.9 is considered overweight. ? A BMI of 30 and above is considered obese.   . Maintain normal blood lipids and cholesterol levels by exercising and minimizing your intake of saturated fat. Eat a balanced diet with plenty of fruit and vegetables. Blood tests for lipids and cholesterol should begin at age 34 and be repeated every 5 years. If your  lipid or cholesterol levels are high, you are over 50, or you are at high risk for heart disease, you may need your cholesterol levels checked more frequently. Ongoing high lipid and cholesterol levels should be treated with medicines if diet and exercise are not working.  . If you smoke, find out from your health care provider how to quit. If you do not use tobacco, please do not start.  . If you choose to drink alcohol, please do not consume more than 2 drinks per day. One drink is considered to be 12 ounces (355 mL) of beer, 5 ounces (148 mL) of  wine, or 1.5 ounces (44 mL) of liquor.  . If you are 36-9 years old, ask your health care provider if you should take aspirin to prevent strokes.  . Use sunscreen. Apply sunscreen liberally and repeatedly throughout the day. You should seek shade when your shadow is shorter than you. Protect yourself by wearing long sleeves, pants, a wide-brimmed hat, and sunglasses year round, whenever you are outdoors.  . Once a month, do a whole body skin exam, using a mirror to look at the skin on your back. Tell your health care provider of new moles, moles that have irregular borders, moles that are larger than a pencil eraser, or moles that have changed in shape or color.

## 2018-06-02 NOTE — Progress Notes (Signed)
PCP notes: Last OV 04/16/2017   Health maintenance: Up to Date   Abnormal screenings: Hearing. Patient refused referral for audiology/ENT. Discussed following up at [facility]   Patient concerns: None   Nurse concerns: None   Next PCP appt: Schedule as Needed

## 2018-06-02 NOTE — Progress Notes (Signed)
Subjective:   Randy Rogers is a 67 y.o. male who presents for an Initial Medicare Annual Wellness Visit.  Review of Systems  No ROS.  Medicare Wellness Visit. Additional risk factors are reflected in the social history. Cardiac Risk Factors include: advanced age (>34men, >41 women);male gender  Patient lives with 54 year old daughter in a 2 story home. He has a dog Randy Rogers. Patient enjoys traveling, enjoys music, reading, gardening. 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  Patient goes to bed around 10:30pm. Rarely wakes up to go to the bathroom. No CPAP. Wakes up around 4am. Wakes up feeling rested.   Objective:    Today's Vitals   06/02/18 1440  BP: 138/76  Pulse: 66  SpO2: 94%  Weight: 190 lb 3.2 oz (86.3 kg)  Height: 5' 9.5" (1.765 m)   Body mass index is 27.68 kg/m.  Advanced Directives 06/02/2018 05/15/2016  Does Patient Have a Medical Advance Directive? Yes Yes  Type of Paramedic of [place];Living will [place];Living will  Does patient want to make changes to medical advance directive? No - Patient declined -  Copy of [place] in Chart? No - copy requested No - copy requested    Current Medications (verified) Outpatient Encounter Medications as of 06/02/2018  Medication Sig  . fexofenadine (ALLEGRA) 180 MG tablet Take 180 mg by mouth as needed. Reported on 09/24/2015  . fluticasone (FLONASE) 50 MCG/ACT nasal spray Place 2 sprays into both nostrils daily.  Marland Kitchen ibuprofen (ADVIL,MOTRIN) 200 MG tablet Take 200 mg by mouth every 6 (six) hours as needed. Reported on 09/24/2015   No facility-administered encounter medications on file as of 06/02/2018.     Allergies (verified) Patient has no known allergies.   History: Past Medical History:  Diagnosis Date  . Allergy    Past Surgical History:  Procedure Laterality Date  . COLONOSCOPY  2006; 03/11/2011   5 mm polyp removed; 40mm cecal polyp and  diverticulosis  . KNEE ARTHROSCOPY     left   Family History  Problem Relation Age of Onset  . Tuberculosis Mother   . Emphysema Maternal Grandfather        smoker   Social History   Socioeconomic History  . Marital status: Soil scientist    Spouse name: Not on file  . Number of children: Not on file  . Years of education: Not on file  . Highest education level: Not on file  Occupational History  . Not on file  Social Needs  . Financial resource strain: Not on file  . Food insecurity:    Worry: Not on file    Inability: Not on file  . Transportation needs:    Medical: Not on file    Non-medical: Not on file  Tobacco Use  . Smoking status: Never Smoker  . Smokeless tobacco: Never Used  Substance and Sexual Activity  . Alcohol use: Yes    Alcohol/week: 14.0 standard drinks    Types: 14 Standard drinks or equivalent per week  . Drug use: No  . Sexual activity: Yes  Lifestyle  . Physical activity:    Days per week: Not on file    Minutes per session: Not on file  . Stress: Not on file  Relationships  . Social connections:    Talks on phone: Not on file    Gets together: Not on file    Attends religious service: Not on file  Active member of club or organization: Not on file    Attends meetings of clubs or organizations: Not on file    Relationship status: Not on file  Other Topics Concern  . Not on file  Social History Narrative   Divorced twice. 2 adult children and daughter (2003). 1 granddaughter.       Adopted by grandparents.      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 N  Vision? N  Difficulty concentrating or making decisions? N  Walking or climbing stairs? N  Dressing or bathing? N  Doing errands, shopping? N    Preparing Food and eating ? N  Using the Toilet? N  In the past six months, have you accidently leaked urine? N  Do you have problems with loss of bowel control? N  Managing your Medications? N  Managing your Finances? N  Housekeeping or managing your Housekeeping? N  Some recent data might be hidden     Immunizations and Health Maintenance Immunization History  Administered Date(s) Administered  . Influenza, High Dose Seasonal PF 05/05/2018  . Influenza,inj,Quad PF,6+ Mos 05/01/2015, 05/22/2016  . Influenza-Unspecified 06/01/2014, 05/14/2017  . Pneumococcal Conjugate-13 12/16/2016  . Pneumococcal Polysaccharide-23 12/30/2017  . Td 02/26/2007  . Tdap 11/07/2014  . Zoster 02/22/2013  . Zoster Recombinat (Shingrix) 03/10/2018   There are no preventive care reminders to display for this patient.  Patient Care Team: Randy Olp, MD as PCP - General (Family Medicine)  Indicate any recent Medical Services you may have received from other than [facility] providers in the past year (date may be approximate).    Assessment:   This is a routine wellness examination for Randy Rogers.  Hearing/Vision screen  Hearing Screening   Method: Audiometry   125Hz  250Hz  500Hz  1000Hz  2000Hz  3000Hz  4000Hz  6000Hz  8000Hz   Right ear:           Left ear:           Comments: Recommendation made for further hearing testing bilaterally   Recommendation made for [employer] hearing exam   Declined referral for Audiology/ENT   Visual Acuity Screening   Right eye Left eye Both eyes  Without correction:     With correction: 20/20 20/20 20/20   Comments: Wears glasses Gets exams every 2 years. Not consistent with dr   Dietary issues and exercise activities discussed: Current Exercise Habits: Home exercise routine, Type of exercise: walking, Time (Minutes): 60, Frequency (Times/Week): 7, Weekly Exercise (Minutes/Week): 420, Intensity: Mild  Breakfast: Toast, coffee with sweetener and milk  Lunch: Varies and  normally drinks English Style Tea  Dinner: Bar b q pork ribs, baked beans, potatoes, red snapper, roasted potatoes, green beans, with wine to drink Goals    . Exercise 150 min/wk Moderate Activity     Continue to stay active with travel and walking      Depression Screen PHQ 2/9 Scores 06/02/2018 04/16/2017  PHQ - 2 Score 0 0    Fall Risk Fall Risk  06/02/2018 04/16/2017  Falls in the past year? No Yes  Number falls in past yr: - 1  Injury with Fall? - Yes      6CIT Screen 06/02/2018  What Year? 0 points  What month? 0 points  What time? 0 points  Count back from 20 0 points  Months in reverse 0 points  Repeat phrase 0 points  Total Score 0    Screening Tests Health Maintenance  Topic Date Due  . Hepatitis C Screening  09/21/2109 (Originally Apr 03, 1951)  . PNA vac Low Risk Adult (2 of 2 - PCV13) 12/31/2018  . COLONOSCOPY  05/29/2021  . TETANUS/TDAP  11/06/2024  . INFLUENZA VACCINE  Completed           Plan:    Follow UP with PCP as Advised  I have personally reviewed and noted the following in the patient's chart:   . Medical and social history . Use of alcohol, tobacco or illicit drugs  . Current medications and supplements . Functional ability and status . Nutritional status . Physical activity . Advanced directives . List of other physicians . Vitals . Screenings to include cognitive, depression, and falls . Referrals and appointments  In addition, I have reviewed and discussed with patient certain preventive protocols, quality metrics, and best practice recommendations. A written personalized care plan for preventive services as well as general preventive health recommendations were provided to patient.     [place], [place]   56/10/8935

## 2018-06-02 NOTE — Progress Notes (Signed)
I have personally reviewed the Medicare Annual Wellness questionnaire and have noted 1. The patient's medical and social history 2. Their use of alcohol, tobacco or illicit drugs 3. Their current medications and supplements 4. The patient's functional ability including ADL's, fall risks, home safety risks and hearing or visual impairment. 5. Diet and physical activities 6. Evidence for depression or mood disorders 7. Reviewed Updated provider list, see scanned forms and [facility] Snapshot.   The patients weight, height, BMI and visual acuity have been recorded in the chart I have made referrals, counseling and provided education to the patient based review of the above and I have provided the pt with a written personalized care plan for preventive services.  I have provided the patient with a copy of your personalized plan for preventive services. Instructed to take the time to review along with their updated medication list.  Orma Flaming, MD [place]

## 2018-07-30 IMAGING — MR MR HEAD WO/W CM
13 series · 48 of 48 positions shown · IV contrast (multihance)
Comparison: None.

CLINICAL DATA: Fall and head injury 1 month ago with loss of
consciousness following syncopal episode.

EXAM:
MRI HEAD WITHOUT AND WITH CONTRAST
TECHNIQUE: Multiplanar, multiecho pulse sequences of the brain and surrounding
structures were obtained without and with intravenous contrast.
CONTRAST:  18mL MULTIHANCE GADOBENATE DIMEGLUMINE 529 MG/ML IV SOLN

[Series 5: T1 · sagittal · 4.0mm · 0.75mm/px · 1 of 31 slices shown (1 of 3)]
[im 1/31]
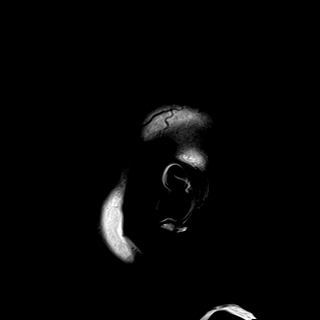

[Series 6: DWI · axial · 3.0mm · 1.44mm/px · z∈[-33,+108]mm · 4 of 88 slices shown (1 of 4)]
[im 1/88]
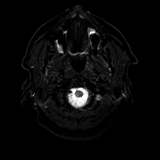
[im 30/88]
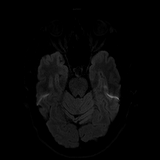
[im 59/88]
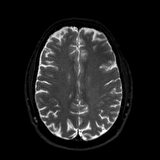
[im 88/88]
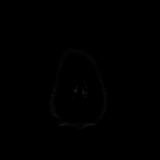

[Series 7: DWI · axial · 3.0mm · 1.44mm/px · z∈[-33,+108]mm · 3 of 44 slices shown (2 of 4)]
[im 1/44]
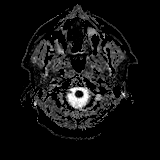
[im 22/44]
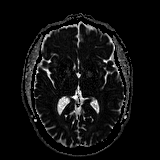
[im 44/44]
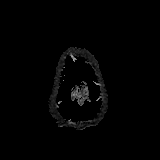

[Series 8: DWI · coronal · 5.0mm · 1.44mm/px · 4 of 60 slices shown (3 of 4)]
[im 1/60]
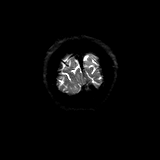
[im 20/60]
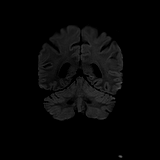
[im 40/60]
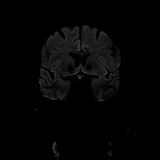
[im 60/60]
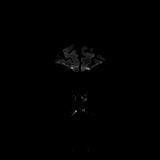

[Series 9: DWI · coronal · 5.0mm · 1.44mm/px · 2 of 30 slices shown (4 of 4)]
[im 1/30]
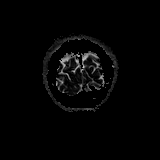
[im 30/30]
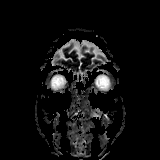

[Series 10: T2 · axial · 4.0mm · 0.36mm/px · z∈[-35,+104]mm · 2 of 28 slices shown]
[im 1/28]
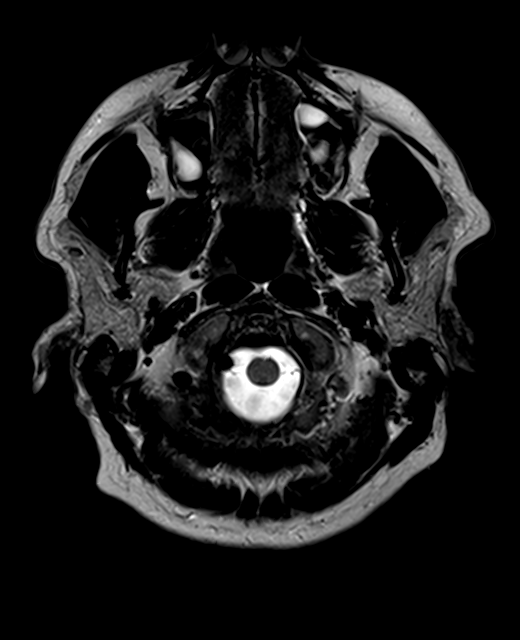
[im 28/28]
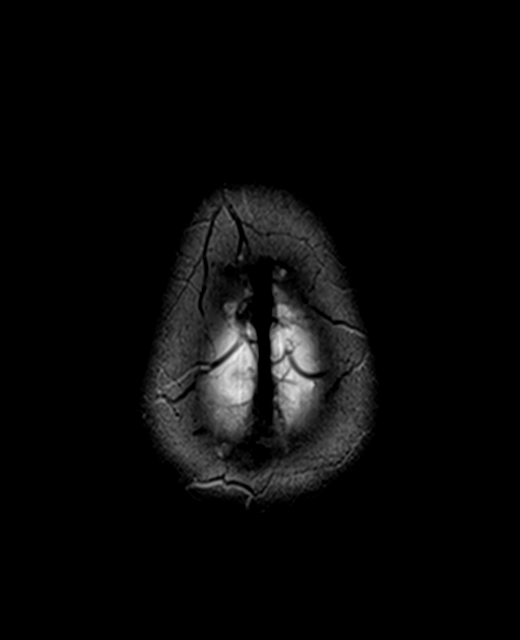

[Series 11: FLAIR · axial · 3.0mm · 0.72mm/px · z∈[-39,+110]mm · 2 of 26 slices shown]
[im 1/26]
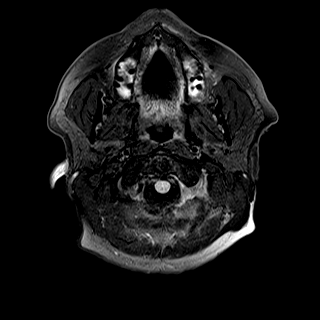
[im 26/26]
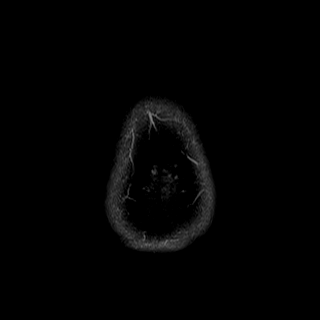

[Series 13: swi_images · axial · 1.5mm · 0.90mm/px · z∈[-37,+105]mm · 6 of 96 slices shown]
[im 1/96]
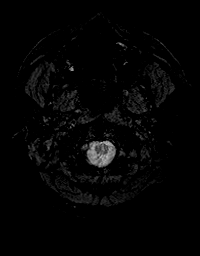
[im 20/96]
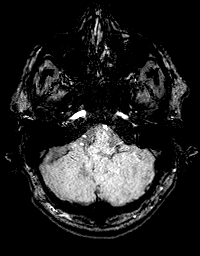
[im 39/96]
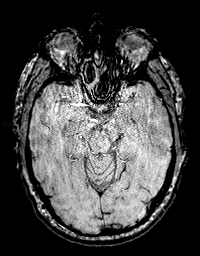
[im 58/96]
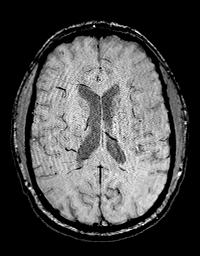
[im 77/96]
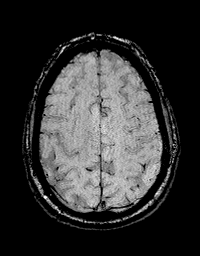
[im 96/96]
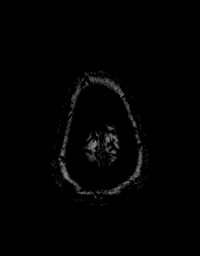

[Series 14: T1 · axial · 1.0mm · 0.90mm/px · z∈[-38,+104]mm · 9 of 144 slices shown (2 of 3)]
[im 1/144]
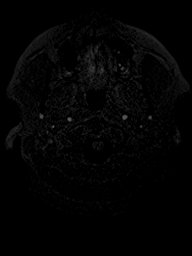
[im 18/144]
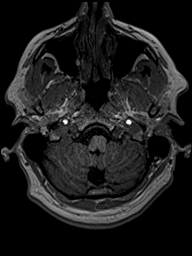
[im 36/144]
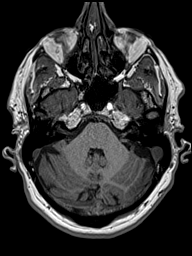
[im 54/144]
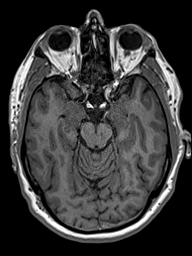
[im 72/144]
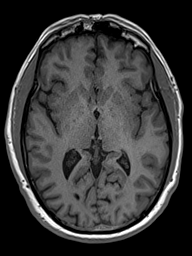
[im 90/144]
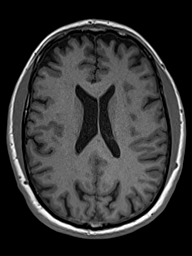
[im 108/144]
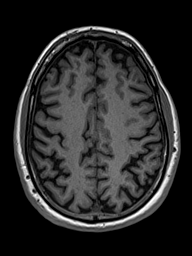
[im 126/144]
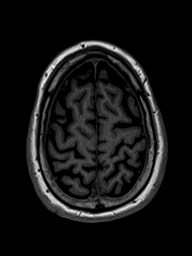
[im 144/144]
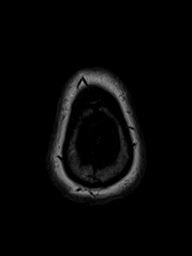

[Series 15: T2 post-contrast · coronal · 4.5mm · 0.36mm/px · 2 of 35 slices shown]
[im 1/35]
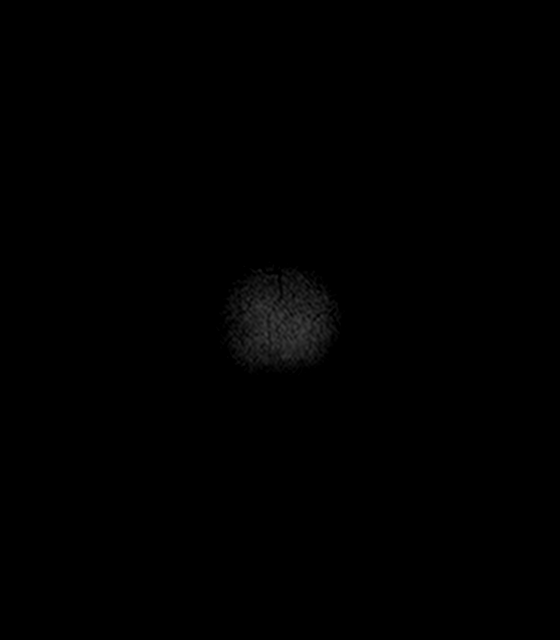
[im 35/35]
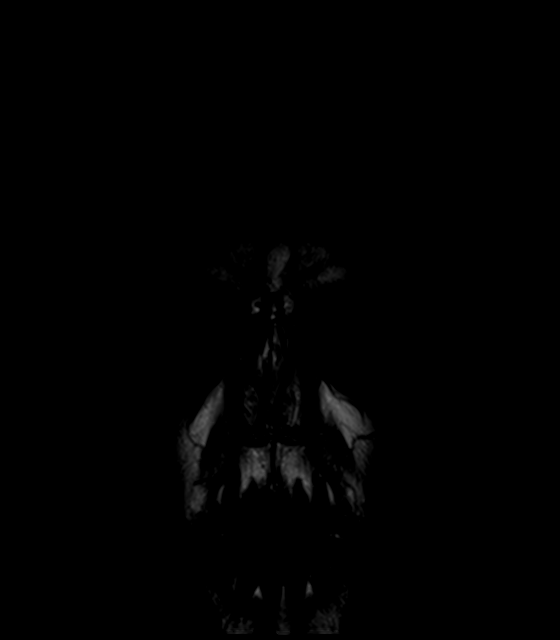

[Series 16: T1 · axial · 1.0mm · 0.90mm/px · z∈[-38,+104]mm · 9 of 144 slices shown (3 of 3)]
[im 1/144]
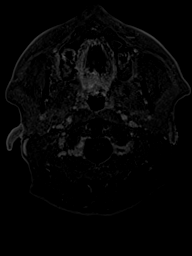
[im 18/144]
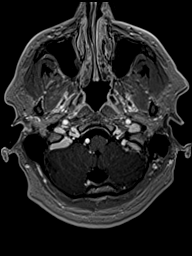
[im 36/144]
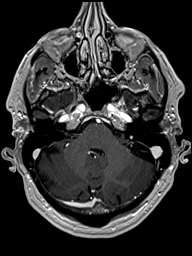
[im 54/144]
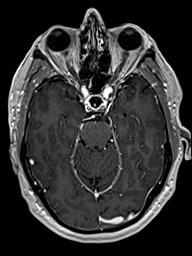
[im 72/144]
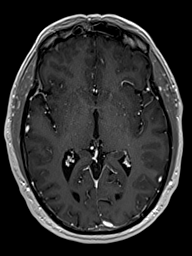
[im 90/144]
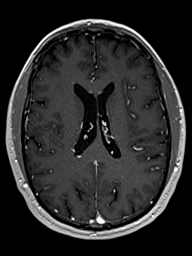
[im 108/144]
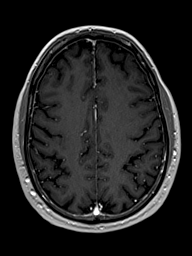
[im 126/144]
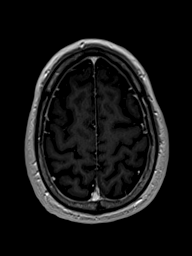
[im 144/144]
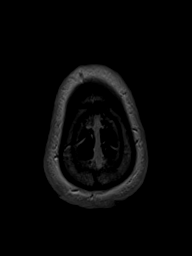

[Series 17: T1 post-contrast · coronal · 4.5mm · 0.72mm/px · 2 of 35 slices shown (1 of 2)]
[im 1/35]
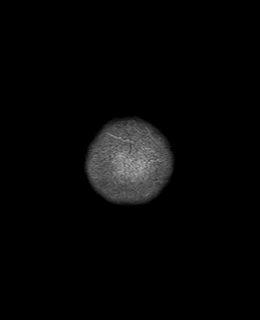
[im 35/35]
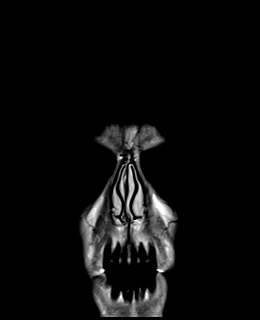

[Series 18: T1 post-contrast · sagittal · 4.0mm · 0.75mm/px · 2 of 31 slices shown (2 of 2)]
[im 1/31]
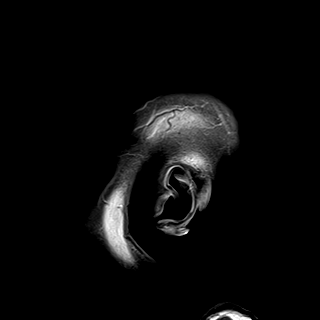
[im 31/31]
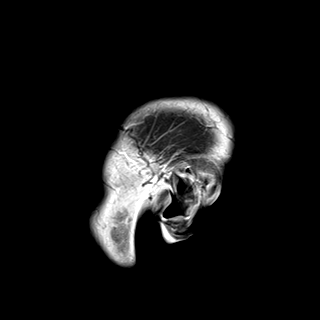

[48 of 48 positions shown; findings below may reference images not displayed]

FINDINGS: Brain: The diffusion-weighted images demonstrate no acute or
subacute infarction. There is no hemorrhage or mass lesion. No focal
encephalomalacia is present. The ventricles are normal size. No
significant extra-axial fluid collection is present.

The postcontrast images demonstrate no pathologic enhancement.

Vascular: Flow is present in the major intracranial arteries.

Skull and upper cervical spine: The skullbase is within normal
limits. Craniocervical junction is normal. Midline sagittal
structures are unremarkable.

Sinuses/Orbits: A polyp or mucous retention cyst is noted anteriorly
in the left maxillary sinus. There is mild mucosal thickening along
the inferior maxillary sinus bilaterally. The remaining paranasal
sinuses and the mastoid air cells are clear bilaterally. The globes
and orbits are within normal limits.
IMPRESSION: 1. Normal MRI appearance of the brain. No acute or focal lesion to
explain syncope. No evidence for trauma.
2. Minimal maxillary sinus disease.

## 2019-02-11 DIAGNOSIS — Z03818 Encounter for observation for suspected exposure to other biological agents ruled out: Secondary | ICD-10-CM | POA: Diagnosis not present

## 2019-06-01 ENCOUNTER — Telehealth: Payer: Self-pay | Admitting: Family Medicine

## 2019-06-01 NOTE — Telephone Encounter (Signed)
I left a message asking the patient to call and schedule Medicare AWV with Loma Sousa ([place]) on 07/04/2019 before seeing Dr. Yong Channel.  If patient calls back, please update appointment notes.  VDM (Dee-Dee)

## 2019-07-04 ENCOUNTER — Ambulatory Visit (INDEPENDENT_AMBULATORY_CARE_PROVIDER_SITE_OTHER): Payer: Medicare HMO

## 2019-07-04 ENCOUNTER — Encounter: Payer: Self-pay | Admitting: Family Medicine

## 2019-07-04 ENCOUNTER — Ambulatory Visit (INDEPENDENT_AMBULATORY_CARE_PROVIDER_SITE_OTHER): Payer: Medicare HMO | Admitting: Family Medicine

## 2019-07-04 ENCOUNTER — Other Ambulatory Visit: Payer: Self-pay

## 2019-07-04 VITALS — BP 132/68 | Temp 98.3°F | Ht 70.0 in | Wt 198.4 lb

## 2019-07-04 VITALS — BP 132/68 | HR 68 | Temp 98.3°F | Ht 70.0 in | Wt 198.4 lb

## 2019-07-04 DIAGNOSIS — Z86010 Personal history of colonic polyps: Secondary | ICD-10-CM | POA: Diagnosis not present

## 2019-07-04 DIAGNOSIS — E785 Hyperlipidemia, unspecified: Secondary | ICD-10-CM | POA: Diagnosis not present

## 2019-07-04 DIAGNOSIS — Z125 Encounter for screening for malignant neoplasm of prostate: Secondary | ICD-10-CM

## 2019-07-04 DIAGNOSIS — I491 Atrial premature depolarization: Secondary | ICD-10-CM | POA: Diagnosis not present

## 2019-07-04 DIAGNOSIS — Z0000 Encounter for general adult medical examination without abnormal findings: Secondary | ICD-10-CM | POA: Diagnosis not present

## 2019-07-04 LAB — COMPREHENSIVE METABOLIC PANEL
ALT: 20 U/L (ref 0–53)
AST: 22 U/L (ref 0–37)
Albumin: 4.5 g/dL (ref 3.5–5.2)
Alkaline Phosphatase: 52 U/L (ref 39–117)
BUN: 17 mg/dL (ref 6–23)
CO2: 26 mEq/L (ref 19–32)
Calcium: 9.2 mg/dL (ref 8.4–10.5)
Chloride: 103 mEq/L (ref 96–112)
Creatinine, Ser: 0.98 mg/dL (ref 0.40–1.50)
GFR: 76.16 mL/min (ref >60.00)
Glucose, Bld: 109 mg/dL — ABNORMAL HIGH (ref 70–99)
Potassium: 4.3 mEq/L (ref 3.5–5.1)
Sodium: 138 mEq/L (ref 135–145)
Total Bilirubin: 0.7 mg/dL (ref 0.2–1.2)
Total Protein: 7.1 g/dL (ref 6.0–8.3)

## 2019-07-04 LAB — LIPID PANEL
Cholesterol: 207 mg/dL — ABNORMAL HIGH (ref 0–200)
HDL: 59.1 mg/dL (ref >39.00)
LDL Cholesterol: 136 mg/dL — ABNORMAL HIGH (ref 0–99)
NonHDL: 148.26
Total CHOL/HDL Ratio: 4
Triglycerides: 63 mg/dL (ref 0.0–149.0)
VLDL: 12.6 mg/dL (ref 0.0–40.0)

## 2019-07-04 LAB — CBC
HCT: 47 % (ref 39.0–52.0)
Hemoglobin: 15.8 g/dL (ref 13.0–17.0)
MCHC: 33.7 g/dL (ref 30.0–36.0)
MCV: 94.4 fl (ref 78.0–100.0)
Platelets: 287 10*3/uL (ref 150.0–400.0)
RBC: 4.98 Mil/uL (ref 4.22–5.81)
RDW: 13.1 % (ref 11.5–15.5)
WBC: 6.7 10*3/uL (ref 4.0–10.5)

## 2019-07-04 LAB — PSA: PSA: 6.37 ng/mL — ABNORMAL HIGH (ref 0.10–4.00)

## 2019-07-04 NOTE — Progress Notes (Signed)
Phone: [phone]   Subjective:  Patient presents today for their annual physical. Chief complaint-noted.   See problem oriented charting- Review of Systems  Constitutional: Negative for chills and fever.  HENT: Negative for ear pain, hearing loss and tinnitus.   Eyes: Negative for blurred vision, double vision and photophobia.  Respiratory: Negative for cough and shortness of breath.   Cardiovascular: Positive for palpitations (known pacs). Negative for chest pain.  Gastrointestinal: Negative for abdominal pain, diarrhea and vomiting.  Genitourinary: Negative for dysuria and frequency.  Musculoskeletal: Negative for myalgias and neck pain.  Skin: Negative for itching and rash.  Neurological: Negative for dizziness and headaches.  Endo/Heme/Allergies: Negative for polydipsia. Does not bruise/bleed easily.  Psychiatric/Behavioral: Negative for depression, substance abuse and suicidal ideas.    The following were reviewed and entered/updated in epic: Past Medical History:  Diagnosis Date  . Allergy    Patient Active Problem List   Diagnosis Date Noted  . Hyperlipidemia 06/14/2014    Priority: Medium  . PAC (premature atrial contraction) 07/04/2019    Priority: Low  . Actinic keratosis 01/07/2010    Priority: Low  . ALLERGIC RHINITIS, SEASONAL 06/03/2007    Priority: Low  . 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 History of colonic polyps 03/04/2007    Priority: Low  . Syncope 04/16/2017   Past Surgical History:  Procedure Laterality Date  . COLONOSCOPY  2006; 03/11/2011   5 mm polyp removed; 66mm cecal polyp and diverticulosis  . KNEE ARTHROSCOPY     left    Family History  Problem Relation Age of Onset  . Tuberculosis Mother   . Emphysema Maternal Grandfather        smoker    Medications- reviewed and updated Current Outpatient Medications  Medication Sig Dispense Refill  . fexofenadine (ALLEGRA) 180 MG tablet Take 180 mg by mouth as needed. Reported  on 09/24/2015    . fluticasone (FLONASE) 50 MCG/ACT nasal spray Place 2 sprays into both nostrils daily. 16 g 3  . ibuprofen (ADVIL,MOTRIN) 200 MG tablet Take 200 mg by mouth every 6 (six) hours as needed. Reported on 09/24/2015     No current facility-administered medications for this visit.     Allergies-reviewed and updated No Known Allergies  Social History   Social History Narrative   Divorced twice. Oldest daughter married to pharmD, homemaker( 1 grandson). Middle child (1 granddaughter)- lecturer/pharmD [employer] Youngest daughter premed as of 2020.       Adopted by grandparents.      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 TM normal. Bridge of nose normal. Eyelids normal.  Neck: no thyromegaly or cervical lymphadenopathy  CV: RRR no murmurs rubs or gallops Lungs: CTAB no crackles, wheeze, rhonchi Abdomen: soft/nontender/nondistended/normal bowel sounds. No rebound or guarding.  Ext: no edema Skin: warm, dry Neuro: grossly normal, moves all extremities, PERRLA   Assessment and Plan  68 y.o. male presenting for annual physical.  Health Maintenance counseling: 1. Anticipatory guidance:  Patient counseled regarding regular dental exams -q6 months, eye exams -yearly,  avoiding smoking and second hand smoke , limiting alcohol to 2 beverages per day .   2. Risk factor reduction:  Advised patient of need for regular exercise and diet rich and fruits and vegetables to reduce risk of heart attack and stroke. Exercise- walking 4-9 miles  Everyday - gets out and walks his dogs. Diet-down 4 lbs from  last visit- has some fluctuation with weight as low as 190 and would like to be in low 190s.  Wt Readings from Last 3 Encounters:  07/04/19 198 lb 6.4 oz (90 kg)  07/04/19 198 lb 6.4 oz (90 kg)  06/02/18 190 lb 3.2 oz (86.3 kg)  3. Immunizations/screenings/ancillary studies- fully up to date  Immunization History  Administered Date(s) Administered  . Fluad Quad(high Dose 65+) 05/11/2019  . Influenza, High Dose Seasonal PF 05/05/2018  . Influenza,inj,Quad PF,6+ Mos 05/01/2015, 05/22/2016  . Influenza-Unspecified 06/01/2014, 05/14/2017  . Pneumococcal Conjugate-13 12/16/2016  . Pneumococcal Polysaccharide-23 12/30/2017  . Td 02/26/2007  . Tdap 11/07/2014  . Zoster 02/22/2013  . Zoster Recombinat (Shingrix) 03/10/2018, 06/24/2018   4. Prostate cancer screening-  denies worsening urinary symptoms. Trend PSA- trend has been rather low risk. 9 years ago psa was 1.53.  Lab Results  Component Value Date   PSA 1.89 09/17/2015   PSA 1.24 04/04/2013   PSA 4.46 (H) 02/15/2013   5. Colon cancer screening - last done 2017. History of colonic polyps with repeat planned 05/29/2021 6. Skin cancer screening- >2 years ago last derm visit. advised regular sunscreen use. Denies worrisome, changing, or new skin lesions.  7. Never smoker  Status of chronic or acute concerns   Hyperlipidemia, unspecified hyperlipidemia type- patient not interested in taking statin even on once a week basis. 10 year ascvd risk using last years labs 14.4% . Update lipids. He would consider if needed for secondary prevention  Diagnosed with PACs after syncopal episode 04/2017- no issues since that time. Occasional palpitations.  MRI was reassuring 05/06/17. Echo reassuring 04/27/17. Frequent PACs on ekg. Ultimately thoguht to be vasovagal episodes without recurrence.   Did a home check on a1c and was normal- last a1c with [place] 2017 was 5.2.   Allergies- does ok on allegra and flonase- if sttops then recurs   Recommended follow  up: 1 year physical   Lab/Order associations:not  fasting   ICD-10-CM   1. Preventative health care  Z00.00 CBC    Comprehensive metabolic panel    Lipid panel    PSA  2. Hyperlipidemia, unspecified hyperlipidemia type  E78.5 CBC    Comprehensive metabolic panel    Lipid panel  3. History of colonic polyps  Z86.010   4. Screening for prostate cancer  Z12.5 PSA  5. PAC (premature atrial contraction)  I49.1    Return precautions advised.  Garret Reddish, MD

## 2019-07-04 NOTE — Progress Notes (Signed)
I have reviewed and agree with note, evaluation, plan.   Stephen Hunter, MD  

## 2019-07-04 NOTE — Progress Notes (Signed)
Subjective:   Randy Rogers is a 68 y.o. male who presents for Medicare Annual/Subsequent preventive examination.  Review of Systems:   Cardiac Risk Factors include: advanced age (>86men, >22 women);male gender    Objective:    Vitals: BP 132/68 (BP Location: Left Arm, Patient Position: Sitting, Cuff Size: Normal)   Temp 98.3 F (36.8 C) (Temporal)   Ht 5\' 10"  (1.778 m)   Wt 198 lb 6.4 oz (90 kg)   BMI 28.47 kg/m   Body mass index is 28.47 kg/m.  Advanced Directives 07/04/2019 06/02/2018 05/15/2016  Does Patient Have a Medical Advance Directive? Yes Yes Yes  Type of Paramedic of [place];Living will [place];Living will [place];Living will  Does patient want to make changes to medical advance directive? No - Patient declined No - Patient declined -  Copy of [place] in Chart? No - copy requested No - copy requested No - copy requested    Tobacco Social History   Tobacco Use  Smoking Status Never Smoker  Smokeless Tobacco Never Used     Counseling given: Not Answered   Clinical Intake:  Pre-visit preparation completed: Yes  Pain : No/denies pain  Diabetes: No  How often do you need to have someone help you when you read instructions, pamphlets, or other written materials from your doctor or pharmacy?: 1 - Never  Interpreter Needed?: No  Information entered by :: Denman George LPN  Past Medical History:  Diagnosis Date  . Allergy    Past Surgical History:  Procedure Laterality Date  . COLONOSCOPY  2006; 03/11/2011   5 mm polyp removed; 88mm cecal polyp and diverticulosis  . KNEE ARTHROSCOPY     left   Family History  Problem Relation Age of Onset  . Tuberculosis Mother   . Emphysema Maternal Grandfather        smoker   Social History   Socioeconomic History  . Marital status: Soil scientist    Spouse name: Not on file  . Number of children: Not on file  . Years  of education: Not on file  . Highest education level: Not on file  Occupational History  . Occupation: Retired   Scientific laboratory technician  . Financial resource strain: Not on file  . Food insecurity    Worry: Not on file    Inability: Not on file  . Transportation needs    Medical: Not on file    Non-medical: Not on file  Tobacco Use  . Smoking status: Never Smoker  . Smokeless tobacco: Never Used  Substance and Sexual Activity  . Alcohol use: Yes    Alcohol/week: 14.0 standard drinks    Types: 14 Standard drinks or equivalent per week  . Drug use: No  . Sexual activity: Yes  Lifestyle  . Physical activity    Days per week: Not on file    Minutes per session: Not on file  . Stress: Not on file  Relationships  . Social Herbalist on phone: Not on file    Gets together: Not on file    Attends religious service: Not on file    Active member of club or organization: Not on file    Attends meetings of clubs or organizations: Not on file    Relationship status: Not on file  Other Topics Concern  . Not on file  Social History Narrative   Divorced twice. 2 adult children and daughter (  2003). 1 granddaughter.       Adopted by grandparents.      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 fexofenadine (ALLEGRA) 180 MG tablet Take 180 mg by mouth as needed. Reported on 09/24/2015  . fluticasone (FLONASE) 50 MCG/ACT nasal spray Place 2 sprays into both nostrils daily.  Marland Kitchen ibuprofen (ADVIL,MOTRIN) 200 MG tablet Take 200 mg by mouth every 6 (six) hours as needed. Reported on 09/24/2015   No facility-administered encounter medications on file as of 07/04/2019.     Activities of Daily Living In your present state of health, do you have any difficulty performing the following activities: 07/04/2019  Hearing? N  Difficulty concentrating or making decisions? N  Walking or climbing stairs?  N  Dressing or bathing? N  Doing errands, shopping? N  Preparing Food and eating ? N  Using the Toilet? N  In the past six months, have you accidently leaked urine? N  Do you have problems with loss of bowel control? N  Managing your Medications? N  Managing your Finances? N  Housekeeping or managing your Housekeeping? N  Some recent data might be hidden    Patient Care Team: Marin Olp, MD as PCP - General (Family Medicine)   Assessment:   This is a routine wellness examination for Randy Rogers.  Exercise Activities and Dietary recommendations Current Exercise Habits: Home exercise routine, Type of exercise: walking, Time (Minutes): 45, Frequency (Times/Week): 5, Weekly Exercise (Minutes/Week): 225, Intensity: Mild  Goals    . Exercise 150 min/wk Moderate Activity     Continue to stay active with travel and walking       Fall Risk Fall Risk  07/04/2019 06/02/2018 04/16/2017  Falls in the past year? 0 No Yes  Number falls in past yr: - - 1  Injury with Fall? 0 - Yes  Follow up Falls evaluation completed;Education provided;Falls prevention discussed - -   Is the patient's home free of loose throw rugs in walkways, pet beds, electrical cords, etc?   yes      Grab bars in the bathroom? yes      Handrails on the stairs?   yes      Adequate lighting?   yes  Timed Get Up and Go Performed: completed and within normal timeframe; no gait abnormalities noted   Depression Screen PHQ 2/9 Scores 07/04/2019 06/02/2018 06/02/2018 04/16/2017  PHQ - 2 Score 0 0 0 0  PHQ- 9 Score - 0 - -    Cognitive Function-no cognitive concerns at this time      6CIT Screen 07/04/2019 06/02/2018  What Year? 0 points 0 points  What month? 0 points 0 points  What time? 0 points 0 points  Count back from 20 0 points 0 points  Months in reverse 0 points 0 points  Repeat phrase 0 points 0 points  Total Score 0 0    Immunization History  Administered Date(s) Administered  . Fluad Quad(high Dose 65+)  05/11/2019  . Influenza, High Dose Seasonal PF 05/05/2018  . Influenza,inj,Quad PF,6+ Mos 05/01/2015, 05/22/2016  . Influenza-Unspecified 06/01/2014, 05/14/2017  . Pneumococcal Conjugate-13 12/16/2016  . Pneumococcal Polysaccharide-23 12/30/2017  . Td 02/26/2007  . Tdap 11/07/2014  . Zoster 02/22/2013  . Zoster Recombinat (Shingrix) 03/10/2018, 06/24/2018    Qualifies for Shingles Vaccine? Shingrix completed   Screening Tests  Health Maintenance  Topic Date Due  . Hepatitis C Screening  09/21/2109 (Originally 10/24/1950)  . COLONOSCOPY  05/29/2021  . TETANUS/TDAP  11/06/2024  . INFLUENZA VACCINE  Completed  . PNA vac Low Risk Adult  Completed   Cancer Screenings: Lung: Low Dose CT Chest recommended if Age 35-80 years, 30 pack-year currently smoking OR have quit w/in 15years. Patient does not qualify. Colorectal: Colonoscopy 05/29/16 with Dr. Carlean Purl       Plan:  I have personally reviewed and addressed the Medicare Annual Wellness questionnaire and have noted the following in the patient's chart:  A. Medical and social history B. Use of alcohol, tobacco or illicit drugs  C. Current medications and supplements D. Functional ability and status E.  Nutritional status F.  Physical activity G. Advance directives H. List of other physicians I.  Hospitalizations, surgeries, and ER visits in previous 12 months J.  [place] such as hearing and vision if needed, cognitive and depression L. Referrals, records requested, and appointments- none   In addition, I have reviewed and discussed with patient certain preventive protocols, quality metrics, and best practice recommendations. A written personalized care plan for preventive services as well as general preventive health recommendations were provided to patient.   Signed,  Denman George, LPN  Nurse Health Advisor   Nurse Notes: no additional

## 2019-07-04 NOTE — Patient Instructions (Addendum)
No changes today   Please stop by lab before you go If you do not have mychart- we will call you about results within 5 business days of us receiving them.  If you have mychart- we will send your results within 3 business days of us receiving them.  If abnormal or we want to clarify a result, we will call or mychart you to make sure you receive the message.  If you have questions or concerns or don't hear within 5-7 days, please send us a message or call us.    

## 2019-07-04 NOTE — Patient Instructions (Signed)
[name] , Thank you for taking time to come for your Medicare Wellness Visit. I appreciate your ongoing commitment to your health goals. Please review the following plan we discussed and let me know if I can assist you in the future.   Screening recommendations/referrals: Colorectal Screening: up to date; colonoscopy 05/29/16 with Dr. Carlean Purl   Vision and Dental Exams: Recommended annual ophthalmology exams for early detection of glaucoma and other disorders of the eye Recommended annual dental exams for proper oral hygiene  Vaccinations: Influenza vaccine: completed 05/11/19 Pneumococcal vaccine: up to date; last 12/30/17 Tdap vaccine: up to date; last 11/07/14  Shingles vaccine: Shingrix completed   Advanced directives: Please bring a copy of your POA (Power of [place]) and/or Living Will to your next appointment.  Goals: Recommend to drink at least 6-8 8oz glasses of water per day and consume a balanced diet rich in fresh fruits and vegetables.   Next appointment: Please schedule your Annual Wellness Visit with your Nurse Health Advisor in one year.  Preventive Care 56 Years and Older, Male Preventive care refers to lifestyle choices and visits with your health care provider that can promote health and wellness. What does preventive care include?  A yearly physical exam. This is also called an annual well check.  Dental exams once or twice a year.  Routine eye exams. Ask your health care provider how often you should have your eyes checked.  Personal lifestyle choices, including:  Daily care of your teeth and gums.  Regular physical activity.  Eating a healthy diet.  Avoiding tobacco and drug use.  Limiting alcohol use.  Practicing safe sex.  Taking low doses of aspirin every day if recommended by your health care provider..  Taking vitamin and mineral supplements as recommended by your health care provider. What happens during an annual well check? The services and  screenings done by your health care provider during your annual well check will depend on your age, overall health, lifestyle risk factors, and family history of disease. Counseling  Your health care provider may ask you questions about your:  Alcohol use.  Tobacco use.  Drug use.  Emotional well-being.  Home and relationship well-being.  Sexual activity.  Eating habits.  History of falls.  Memory and ability to understand (cognition).  Work and work Statistician. Screening  You may have the following tests or measurements:  Height, weight, and BMI.  Blood pressure.  Lipid and cholesterol levels. These may be checked every 5 years, or more frequently if you are over 60 years old.  Skin check.  Lung cancer screening. You may have this screening every year starting at age 63 if you have a 30-pack-year history of smoking and currently smoke or have quit within the past 15 years.  Fecal occult blood test (FOBT) of the stool. You may have this test every year starting at age 1.  Flexible sigmoidoscopy or colonoscopy. You may have a sigmoidoscopy every 5 years or a colonoscopy every 10 years starting at age 41.  Prostate cancer screening. Recommendations will vary depending on your family history and other risks.  Hepatitis C blood test.  Hepatitis B blood test.  Sexually transmitted disease (STD) testing.  Diabetes screening. This is done by checking your blood sugar (glucose) after you have not eaten for a while (fasting). You may have this done every 1-3 years.  Abdominal aortic aneurysm (AAA) screening. You may need this if you are a current or former smoker.  Osteoporosis. You may  be screened starting at age 15 if you are at high risk. Talk with your health care provider about your test results, treatment options, and if necessary, the need for more tests. Vaccines  Your health care provider may recommend certain vaccines, such as:  Influenza vaccine. This is  recommended every year.  Tetanus, diphtheria, and acellular pertussis (Tdap, Td) vaccine. You may need a Td booster every 10 years.  Zoster vaccine. You may need this after age 17.  Pneumococcal 13-valent conjugate (PCV13) vaccine. One dose is recommended after age 74.  Pneumococcal polysaccharide (PPSV23) vaccine. One dose is recommended after age 72. Talk to your health care provider about which screenings and vaccines you need and how often you need them. This information is not intended to replace advice given to you by your health care provider. Make sure you discuss any questions you have with your health care provider. Document Released: 09/14/2015 Document Revised: 05/07/2016 Document Reviewed: 06/19/2015 Elsevier Interactive Patient Education  2017 [place] Prevention in the Home Falls can cause injuries. They can happen to people of all ages. There are many things you can do to make your home safe and to help prevent falls. What can I do on the outside of my home?  Regularly fix the edges of walkways and driveways and fix any cracks.  Remove anything that might make you trip as you walk through a door, such as a raised step or threshold.  Trim any bushes or trees on the path to your home.  Use bright outdoor lighting.  Clear any walking paths of anything that might make someone trip, such as rocks or tools.  Regularly check to see if handrails are loose or broken. Make sure that both sides of any steps have handrails.  Any raised decks and porches should have guardrails on the edges.  Have any leaves, snow, or ice cleared regularly.  Use sand or salt on walking paths during winter.  Clean up any spills in your garage right away. This includes oil or grease spills. What can I do in the bathroom?  Use night lights.  Install grab bars by the toilet and in the tub and shower. Do not use towel bars as grab bars.  Use non-skid mats or decals in the tub or  shower.  If you need to sit down in the shower, use a plastic, non-slip stool.  Keep the floor dry. Clean up any water that spills on the floor as soon as it happens.  Remove soap buildup in the tub or shower regularly.  Attach bath mats securely with double-sided non-slip rug tape.  Do not have throw rugs and other things on the floor that can make you trip. What can I do in the bedroom?  Use night lights.  Make sure that you have a light by your bed that is easy to reach.  Do not use any sheets or blankets that are too big for your bed. They should not hang down onto the floor.  Have a firm chair that has side arms. You can use this for support while you get dressed.  Do not have throw rugs and other things on the floor that can make you trip. What can I do in the kitchen?  Clean up any spills right away.  Avoid walking on wet floors.  Keep items that you use a lot in easy-to-reach places.  If you need to reach something above you, use a strong step stool that has  a grab bar.  Keep electrical cords out of the way.  Do not use floor polish or wax that makes floors slippery. If you must use wax, use non-skid floor wax.  Do not have throw rugs and other things on the floor that can make you trip. What can I do with my stairs?  Do not leave any items on the stairs.  Make sure that there are handrails on both sides of the stairs and use them. Fix handrails that are broken or loose. Make sure that handrails are as long as the stairways.  Check any carpeting to make sure that it is firmly attached to the stairs. Fix any carpet that is loose or worn.  Avoid having throw rugs at the top or bottom of the stairs. If you do have throw rugs, attach them to the floor with carpet tape.  Make sure that you have a light switch at the top of the stairs and the bottom of the stairs. If you do not have them, ask someone to add them for you. What else can I do to help prevent falls?   Wear shoes that:  Do not have high heels.  Have rubber bottoms.  Are comfortable and fit you well.  Are closed at the toe. Do not wear sandals.  If you use a stepladder:  Make sure that it is fully opened. Do not climb a closed stepladder.  Make sure that both sides of the stepladder are locked into place.  Ask someone to hold it for you, if possible.  Clearly mark and make sure that you can see:  Any grab bars or handrails.  First and last steps.  Where the edge of each step is.  Use tools that help you move around (mobility aids) if they are needed. These include:  Canes.  Walkers.  Scooters.  Crutches.  Turn on the lights when you go into a dark area. Replace any light bulbs as soon as they burn out.  Set up your furniture so you have a clear path. Avoid moving your furniture around.  If any of your floors are uneven, fix them.  If there are any pets around you, be aware of where they are.  Review your medicines with your doctor. Some medicines can make you feel dizzy. This can increase your chance of falling. Ask your doctor what other things that you can do to help prevent falls. This information is not intended to replace advice given to you by your health care provider. Make sure you discuss any questions you have with your health care provider. Document Released: 06/14/2009 Document Revised: 01/24/2016 Document Reviewed: 09/22/2014 Elsevier Interactive Patient Education  2017 [employer].

## 2019-07-05 ENCOUNTER — Other Ambulatory Visit: Payer: Self-pay

## 2019-07-05 DIAGNOSIS — R972 Elevated prostate specific antigen [PSA]: Secondary | ICD-10-CM

## 2019-08-02 ENCOUNTER — Other Ambulatory Visit (INDEPENDENT_AMBULATORY_CARE_PROVIDER_SITE_OTHER): Payer: Medicare HMO

## 2019-08-02 ENCOUNTER — Other Ambulatory Visit: Payer: Self-pay

## 2019-08-02 DIAGNOSIS — Z87438 Personal history of other diseases of male genital organs: Secondary | ICD-10-CM

## 2019-08-02 DIAGNOSIS — R972 Elevated prostate specific antigen [PSA]: Secondary | ICD-10-CM

## 2019-08-02 HISTORY — DX: Personal history of other diseases of male genital organs: Z87.438

## 2019-08-02 LAB — PSA: PSA: 6.83 ng/mL — ABNORMAL HIGH (ref 0.10–4.00)

## 2019-08-08 ENCOUNTER — Ambulatory Visit (INDEPENDENT_AMBULATORY_CARE_PROVIDER_SITE_OTHER): Payer: Medicare HMO | Admitting: Family Medicine

## 2019-08-08 ENCOUNTER — Encounter: Payer: Self-pay | Admitting: Family Medicine

## 2019-08-08 ENCOUNTER — Other Ambulatory Visit: Payer: Self-pay

## 2019-08-08 VITALS — Temp 97.5°F | Ht 70.0 in | Wt 198.0 lb

## 2019-08-08 DIAGNOSIS — R972 Elevated prostate specific antigen [PSA]: Secondary | ICD-10-CM | POA: Diagnosis not present

## 2019-08-08 DIAGNOSIS — R509 Fever, unspecified: Secondary | ICD-10-CM | POA: Diagnosis not present

## 2019-08-08 DIAGNOSIS — M545 Low back pain, unspecified: Secondary | ICD-10-CM

## 2019-08-08 DIAGNOSIS — Z20822 Contact with and (suspected) exposure to covid-19: Secondary | ICD-10-CM

## 2019-08-08 DIAGNOSIS — R300 Dysuria: Secondary | ICD-10-CM | POA: Diagnosis not present

## 2019-08-08 MED ORDER — SULFAMETHOXAZOLE-TRIMETHOPRIM 800-160 MG PO TABS: 1.0000 | ORAL_TABLET | Freq: Two times a day (BID) | ORAL | 0 refills | Status: DC

## 2019-08-08 NOTE — Patient Instructions (Signed)
There are no preventive care reminders to display for this patient.  Depression screen [facility] 2/9 07/04/2019 07/04/2019 06/02/2018  Decreased Interest 0 0 0  Down, Depressed, Hopeless 0 0 0  PHQ - 2 Score 0 0 0  Altered sleeping 0 - 0  Tired, decreased energy 0 - 0  Change in appetite 0 - 0  Feeling bad or failure about yourself  0 - 0  Trouble concentrating 0 - 0  Moving slowly or fidgety/restless 0 - 0  Suicidal thoughts 0 - 0  PHQ-9 Score 0 - 0  Difficult doing work/chores - - Not difficult at all    Recommended follow up: No follow-ups on file.

## 2019-08-08 NOTE — Progress Notes (Addendum)
Phone [phone] Virtual visit via Video note   Subjective:  Chief complaint: Chief Complaint  Patient presents with  . Fever  . Dysuria    lower back pain with flank pain.     This visit type was conducted due to national recommendations for restrictions regarding the COVID-19 Pandemic (e.g. social distancing).  This format is felt to be most appropriate for this patient at this time balancing risks to patient and risks to population by having him in for in person visit.  No physical exam was performed (except for noted visual exam or audio findings with Telehealth visits).    Our team/I connected with Randy Rogers at  1:00 PM EST by a video enabled telemedicine application ([email or web address] or caregility through epic) and verified that I am speaking with the correct person using two identifiers.  Location patient: [facility] Persons participating in the virtual visit:  patient  Our team/I discussed the limitations of evaluation and management by telemedicine and the availability of in person appointments. In light of current covid-19 pandemic, patient also understands that we are trying to protect them by minimizing in office contact if at all possible.  The patient expressed consent for telemedicine visit and agreed to proceed. Patient understands insurance will be billed.   ROS- Review of Systems  Constitutional: Positive for chills and fever.       Sunday night only got up to 102.3  HENT: Negative.   Eyes: Negative.   Respiratory: Negative.   Cardiovascular: Negative.   Gastrointestinal: Negative.   Genitourinary: Positive for dysuria and flank pain.  Skin: Negative.   Neurological: Negative.   Endo/Heme/Allergies: Negative.   Psychiatric/Behavioral: Negative.      Past Medical History-  Patient Active Problem List   Diagnosis Date Noted  . Hyperlipidemia 06/14/2014    Priority: Medium  . PAC (premature atrial contraction) 07/04/2019   Priority: Low  . Actinic keratosis 01/07/2010    Priority: Low  . ALLERGIC RHINITIS, SEASONAL 06/03/2007    Priority: Low  . 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 History of colonic polyps 03/04/2007    Priority: Low  . Syncope 04/16/2017    Medications- reviewed and updated Current Outpatient Medications  Medication Sig Dispense Refill  . fexofenadine (ALLEGRA) 180 MG tablet Take 180 mg by mouth as needed. Reported on 09/24/2015    . fluticasone (FLONASE) 50 MCG/ACT nasal spray Place 2 sprays into both nostrils daily. 16 g 3  . ibuprofen (ADVIL,MOTRIN) 200 MG tablet Take 200 mg by mouth every 6 (six) hours as needed. Reported on 09/24/2015    . sulfamethoxazole-trimethoprim (BACTRIM DS) 800-160 MG tablet Take 1 tablet by mouth 2 (two) times daily. 28 tablet 0   No current facility-administered medications for this visit.      Objective:  Temp (!) 97.5 F (36.4 C) (Temporal)   Ht 5\' 10"  (1.778 m)   Wt 198 lb (89.8 kg)   BMI 28.41 kg/m  self reported vitals Gen: NAD, resting comfortably Lungs: nonlabored, normal respiratory rate  Skin: appears dry, no obvious rash     Assessment and Plan   # concern for UTI S:Patient started with fever on saturday night. Temp did get up to 102.3 by Sunday night. Woke up with sheets soaked. Took ibuprofen at 8 PM last night and has not taken any more today.    Patient is having some lower back pain and and right side pain, more of a mild to  moderate ache and seems to be position related- some right CVA tenderness. The pain has been off and on from June in that area but has been active-has worsened in recent days..   Patient is also having dysuria as well as slight pain with ejaculation. No discharge from the penis. he denies any frequency or urgency.  He denies any nausea or vomiting.   In June had similar symptoms and had dark and cloudy urine - had covid antibodies tested because he had a fever prior- and this was negative.  A/P:  68 year old largely healthy male with low back pain, fever of above 102 last night, dysuria.  He also has a history of elevated PSA noted about a month ago and repeat PSA 6 days ago remained elevated at 6.83.  We need to rule out COVID-19 at [facility] with the fever-he agrees to go get tested today and to quarantine until he has a negative result.  On the other hand I am more concerned about UTI/potential prostatitis.  In light of fever, elevated PSA, dysuria we opted to treat with 2 weeks of Bactrim.  Once he has a negative COVID-19 test we can see him back in the office sometime in the 10 to 14-day range and get UA and urine culture as well as update rectal exam.  We discussed that depending on how he is doing potentially extending antibiotics for a full 6 weeks.  Has also had some pain with ejaculation but no penile discharge-could have a discussion about potential need for STD testing at follow-up depending on course and recent sexual history-we will inquire further at next visit  We previously referred to urology but we could potentially repeat PSA after antibiotics as well.  Recommended follow up: Within 2 weeks in office if COVID-19 test negative  Lab/Order associations:   ICD-10-CM   1. Fever, unspecified fever cause  R50.9 Novel Coronavirus, NAA ([facility])    CANCELED: Novel Coronavirus, NAA ([facility])  2. Dysuria  R30.0   3. Low back pain, unspecified back pain laterality, unspecified chronicity, unspecified whether sciatica present  M54.5   4. Elevated PSA  R97.20     Meds ordered this encounter  Medications  . sulfamethoxazole-trimethoprim (BACTRIM DS) 800-160 MG tablet    Sig: Take 1 tablet by mouth 2 (two) times daily.    Dispense:  28 tablet    Refill:  0    Return precautions advised.  We also discussed emergency room precautions-discussed with prostatitis people can get sick rather quickly and indications to go to the hospital Garret Reddish, MD

## 2019-08-10 LAB — NOVEL CORONAVIRUS, NAA: SARS-CoV-2, NAA: NOT DETECTED

## 2019-08-16 ENCOUNTER — Telehealth: Payer: Self-pay | Admitting: Family Medicine

## 2019-08-16 NOTE — Telephone Encounter (Signed)
Patient came into the office and was concerned he had not had a call back regarding a follow up from 12/7 visit. He requested an in office visit for 12/18 to speak with Dr. Yong Channel. Patient would like a call prior to to ensure a smooth visit. Call patient before Friday.

## 2019-08-17 NOTE — Telephone Encounter (Signed)
FYI: Pt states 3 days ago his temp returned to normal. After 2 days of bactrim back pain and kidney pain went away along with the burning with urination. He seems to have had a lot of HA's that have subsided about 2 days ago, his night sweats have continued up until 3 days ago. He has not consumed any alcohol while on antibiotic. Pt feels well as of speaking with him today. Pt has appointment on Friday.

## 2019-08-19 ENCOUNTER — Other Ambulatory Visit: Payer: Self-pay

## 2019-08-19 ENCOUNTER — Encounter: Payer: Self-pay | Admitting: Family Medicine

## 2019-08-19 ENCOUNTER — Ambulatory Visit (INDEPENDENT_AMBULATORY_CARE_PROVIDER_SITE_OTHER): Payer: Medicare HMO | Admitting: Family Medicine

## 2019-08-19 VITALS — BP 138/76 | HR 68 | Temp 97.6°F | Ht 70.0 in | Wt 195.6 lb

## 2019-08-19 DIAGNOSIS — R972 Elevated prostate specific antigen [PSA]: Secondary | ICD-10-CM

## 2019-08-19 DIAGNOSIS — R300 Dysuria: Secondary | ICD-10-CM | POA: Diagnosis not present

## 2019-08-19 DIAGNOSIS — N418 Other inflammatory diseases of prostate: Secondary | ICD-10-CM | POA: Diagnosis not present

## 2019-08-19 DIAGNOSIS — I491 Atrial premature depolarization: Secondary | ICD-10-CM | POA: Diagnosis not present

## 2019-08-19 LAB — POC URINALSYSI DIPSTICK (AUTOMATED)
Bilirubin, UA: NEGATIVE
Blood, UA: NEGATIVE
Glucose, UA: NEGATIVE
Ketones, UA: POSITIVE
Leukocytes, UA: NEGATIVE
Nitrite, UA: NEGATIVE
Protein, UA: POSITIVE — AB
Spec Grav, UA: 1.025 (ref 1.010–1.025)
Urobilinogen, UA: 0.2 E.U./dL
pH, UA: 6 (ref 5.0–8.0)

## 2019-08-19 MED ORDER — SULFAMETHOXAZOLE-TRIMETHOPRIM 800-160 MG PO TABS: 1.0000 | ORAL_TABLET | Freq: Two times a day (BID) | ORAL | 0 refills | Status: DC

## 2019-08-19 NOTE — Progress Notes (Signed)
Phone [phone] In person visit   Subjective:   Randy Rogers is a 68 y.o. year old very pleasant male patient who presents for/with See problem oriented charting Chief Complaint  Patient presents with  . Follow-up    ROS- Review of Systems  Constitutional: Positive for chills and fever.       Stopped after day 5 of abx   HENT: Negative.   Eyes: Negative.   Cardiovascular: Negative.  Negative for palpitations.  Gastrointestinal: Negative.   Genitourinary: Negative.   Musculoskeletal: Negative.   Skin: Negative.   Neurological: Negative.  Negative for headaches.  Endo/Heme/Allergies: Negative.   Psychiatric/Behavioral: Negative.      This visit occurred during the SARS-CoV-2 public health emergency.  Safety protocols were in place, including screening questions prior to the visit, additional usage of staff PPE, and extensive cleaning of exam room while observing appropriate contact time as indicated for disinfecting solutions.   Past Medical History-  Patient Active Problem List   Diagnosis Date Noted  . Hyperlipidemia 06/14/2014    Priority: Medium  . PAC (premature atrial contraction) 07/04/2019    Priority: Low  . Actinic keratosis 01/07/2010    Priority: Low  . ALLERGIC RHINITIS, SEASONAL 06/03/2007    Priority: Low  . 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 History of colonic polyps 03/04/2007    Priority: Low  . Syncope 04/16/2017    Medications- reviewed and updated Current Outpatient Medications  Medication Sig Dispense Refill  . fexofenadine (ALLEGRA) 180 MG tablet Take 180 mg by mouth as needed. Reported on 09/24/2015    . fluticasone (FLONASE) 50 MCG/ACT nasal spray Place 2 sprays into both nostrils daily. 16 g 3  . ibuprofen (ADVIL,MOTRIN) 200 MG tablet Take 200 mg by mouth every 6 (six) hours as needed. Reported on 09/24/2015    . sulfamethoxazole-trimethoprim (BACTRIM DS) 800-160 MG tablet Take 1 tablet by mouth 2 (two) times daily. 56 tablet 0    No current facility-administered medications for this visit.     Objective:  BP 138/76   Pulse 68   Temp 97.6 F (36.4 C) (Temporal)   Ht 5\' 10"  (1.778 m)   Wt 195 lb 9.6 oz (88.7 kg)   SpO2 98%   BMI 28.07 kg/m  Gen: NAD, resting comfortably CV: RRR no murmurs rubs or gallops Lungs: CTAB no crackles, wheeze, rhonchi Abdomen: soft/nontender/nondistended/normal bowel sounds.  Ext: no edema Skin: warm, dry Rectal: Slightly boggy and tender prostate, diffusely enlarged prostate     Assessment and Plan   #Prostatitis S: Patient presented for virtual visit approximately 12 days ago.  Prior to that he had a physical in November with an elevated PSA.  During his video visit patient noted fever, low back pain and we were concerned about possible prostatitis.  We treated him with Bactrim for 2 weeks with plan of recheck today for prostate exam, UA, urine culture-previously not able to have him in the office as he was undergoing COVID-19 testing due to fever  Patient reports his fever resolved in 5 days, back pain was gone within 2 days.  Prior was having some dysuria which has resolved.  Prior was having some slight pain with ejaculation-has not ejaculated since that time so unsure if that has improved.  Never had penile discharge.  Patient admits to having unprotected sex in the past but has never had unprotected sex and then not gotten tested for STDs.  He reflects that over the last few months-reports  had intermittent rectal pain but has not had none since that time.  Interestingly patient with a history of PACs and reports has not gone 10 days without PACs and prolonged.  But has had none since starting the antibiotics after 2 days.  He also stopped taking alcohol in the time but he states he has stopped drinking alcohol in the past and still had PACs A/P: I think patient's picture points toward prostatitis as the cause of his symptoms.  Fever, low back pain, rectal pain resolved on  antibiotics.  He still has a mildly tender prostate and we opted to extend antibiotics from 2 weeks to full 6 weeks.  We will recheck PSA 7 days after that.  If PSA remains elevated he will keep urology referral-otherwise we may cancel this.  Lab/Order associations:   ICD-10-CM   1. Bacterial prostatitis  N41.8   2. Dysuria  R30.0 POC UA    Urine culture  3. Elevated PSA  R97.20 PSA future  4. PAC (premature atrial contraction)  I49.1     Meds ordered this encounter  Medications  . sulfamethoxazole-trimethoprim (BACTRIM DS) 800-160 MG tablet    Sig: Take 1 tablet by mouth 2 (two) times daily.    Dispense:  56 tablet    Refill:  0   Return precautions advised.  Garret Reddish, MD

## 2019-08-19 NOTE — Patient Instructions (Addendum)
With your further reflection on prior symptoms as well your rectal exam today-I suspect your symptoms have been caused by prostatitis.  We are going to treat with a full 6 weeks of antibiotics.  I also am concerned your elevated PSA may be related to the prostatitis.  We will get urinalysis and urine culture today and I would like for you to schedule a lab visit about a week after he finished antibiotics to recheck the PSA.  If urology tries to schedule you before a repeat PSA-try to push that out as we may be able to cancel that visit.   Please stop by lab before you go-urine only If you do not have mychart- we will call you about results within 5 business days of [place] receiving them.  If you have mychart- we will send your results within 3 business days of [place] receiving them.  If abnormal or we want to clarify a result, we will call or mychart you to make sure you receive the message.  If you have questions or concerns or don't hear within 5-7 days, please send [place] a message or call us.    Recommended follow up: 07/03/2020 for physical or later (or sooner if symptoms do not resolve or you have new or worsening symptoms)

## 2019-08-20 LAB — URINE CULTURE
MICRO NUMBER:: 1212934
Result:: NO GROWTH
SPECIMEN QUALITY:: ADEQUATE

## 2019-08-20 NOTE — Assessment & Plan Note (Signed)
Interestingly patient with a history of PACs and reports has not gone 10 days without PACs and prolonged.  But has had none since starting the antibiotics after 2 days.  He also stopped taking alcohol in the time but he states he has stopped drinking alcohol in the past and still had PACs

## 2019-09-03 ENCOUNTER — Encounter: Payer: Self-pay | Admitting: Family Medicine

## 2019-09-05 ENCOUNTER — Ambulatory Visit: Payer: Medicare HMO | Attending: Internal Medicine

## 2019-09-05 DIAGNOSIS — R238 Other skin changes: Secondary | ICD-10-CM | POA: Diagnosis not present

## 2019-09-05 DIAGNOSIS — U071 COVID-19: Secondary | ICD-10-CM | POA: Diagnosis not present

## 2019-09-06 LAB — NOVEL CORONAVIRUS, NAA: SARS-CoV-2, NAA: NOT DETECTED

## 2019-09-17 ENCOUNTER — Encounter: Payer: Self-pay | Admitting: Family Medicine

## 2019-09-22 ENCOUNTER — Other Ambulatory Visit: Payer: Self-pay

## 2019-09-22 NOTE — Telephone Encounter (Signed)
Called reviewed has lab app in the morning no other questions.

## 2019-09-23 ENCOUNTER — Other Ambulatory Visit (INDEPENDENT_AMBULATORY_CARE_PROVIDER_SITE_OTHER): Payer: Medicare HMO

## 2019-09-23 DIAGNOSIS — R972 Elevated prostate specific antigen [PSA]: Secondary | ICD-10-CM

## 2019-09-23 LAB — PSA: PSA: 6.12 ng/mL — ABNORMAL HIGH (ref 0.10–4.00)

## 2019-09-26 ENCOUNTER — Encounter: Payer: Self-pay | Admitting: Family Medicine

## 2019-10-10 ENCOUNTER — Ambulatory Visit: Payer: Medicare HMO | Attending: Internal Medicine

## 2019-10-10 DIAGNOSIS — Z23 Encounter for immunization: Secondary | ICD-10-CM | POA: Insufficient documentation

## 2019-10-10 NOTE — Progress Notes (Signed)
   Covid-19 Vaccination [facility]  Name:  Randy Rogers    MRN: [id number] DOB: February 27, 1951  10/10/2019  [name] was observed post Covid-19 immunization for 15 minutes without incidence. He was provided with Vaccine Information Sheet and instruction to access the V-Safe system.   [name] was instructed to call 911 with any severe reactions post vaccine: Marland Kitchen Difficulty breathing  . Swelling of your face and throat  . A fast heartbeat  . A bad rash all over your body  . Dizziness and weakness    Immunizations Administered    Name Date Dose VIS Date Route   Pfizer COVID-19 Vaccine 10/10/2019  9:37 AM 0.3 mL 08/12/2019 Intramuscular   Manufacturer: [place]   Lot: [id number]   [place]: [id number]

## 2019-10-14 DIAGNOSIS — N5201 Erectile dysfunction due to arterial insufficiency: Secondary | ICD-10-CM | POA: Diagnosis not present

## 2019-10-14 DIAGNOSIS — R972 Elevated prostate specific antigen [PSA]: Secondary | ICD-10-CM | POA: Diagnosis not present

## 2019-10-26 ENCOUNTER — Ambulatory Visit: Payer: Medicare HMO

## 2019-11-03 ENCOUNTER — Ambulatory Visit: Payer: Medicare HMO | Attending: Internal Medicine

## 2019-11-03 DIAGNOSIS — Z23 Encounter for immunization: Secondary | ICD-10-CM | POA: Insufficient documentation

## 2019-11-23 ENCOUNTER — Encounter: Payer: Self-pay | Admitting: Family Medicine

## 2019-11-25 DIAGNOSIS — R972 Elevated prostate specific antigen [PSA]: Secondary | ICD-10-CM | POA: Diagnosis not present

## 2019-11-25 DIAGNOSIS — N429 Disorder of prostate, unspecified: Secondary | ICD-10-CM | POA: Diagnosis not present

## 2019-12-04 ENCOUNTER — Encounter: Payer: Self-pay | Admitting: Family Medicine

## 2020-03-21 DIAGNOSIS — H52223 Regular astigmatism, bilateral: Secondary | ICD-10-CM | POA: Diagnosis not present

## 2020-07-06 ENCOUNTER — Ambulatory Visit: Payer: Medicare HMO

## 2020-07-16 ENCOUNTER — Ambulatory Visit (INDEPENDENT_AMBULATORY_CARE_PROVIDER_SITE_OTHER): Payer: Medicare HMO

## 2020-07-16 DIAGNOSIS — Z0000 Encounter for general adult medical examination without abnormal findings: Secondary | ICD-10-CM

## 2020-07-16 NOTE — Patient Instructions (Addendum)
Randy Rogers , Thank you for taking time to come for your Medicare Wellness Visit. I appreciate your ongoing commitment to your health goals. Please review the following plan we discussed and let me know if I can assist you in the future.   Screening recommendations/referrals: Colonoscopy: Done 05/29/16 Recommended yearly ophthalmology/optometry visit for glaucoma screening and checkup Recommended yearly dental visit for hygiene and checkup  Vaccinations: Influenza vaccine: Done 04/27/20 Up to date Pneumococcal vaccine: Up to date Tdap vaccine: Up to date Shingles vaccine: Completed 03/10/18 & 06/24/18   Covid-19: Completed 2/8, 3/4, & 05/25/20  Advanced directives: Copies ion chart  Conditions/risks identified: lower A1C and lose weight   Next appointment: Follow up in one year for your annual wellness visit.   Preventive Care 11 Years and Older, Male Preventive care refers to lifestyle choices and visits with your health care provider that can promote health and wellness. What does preventive care include?  A yearly physical exam. This is also called an annual well check.  Dental exams once or twice a year.  Routine eye exams. Ask your health care provider how often you should have your eyes checked.  Personal lifestyle choices, including:  Daily care of your teeth and gums.  Regular physical activity.  Eating a healthy diet.  Avoiding tobacco and drug use.  Limiting alcohol use.  Practicing safe sex.  Taking low doses of aspirin every day.  Taking vitamin and mineral supplements as recommended by your health care provider. What happens during an annual well check? The services and screenings done by your health care provider during your annual well check will depend on your age, overall health, lifestyle risk factors, and family history of disease. Counseling  Your health care provider may ask you questions about your:  Alcohol use.  Tobacco use.  Drug  use.  Emotional well-being.  Home and relationship well-being.  Sexual activity.  Eating habits.  History of falls.  Memory and ability to understand (cognition).  Work and work Statistician. Screening  You may have the following tests or measurements:  Height, weight, and BMI.  Blood pressure.  Lipid and cholesterol levels. These may be checked every 5 years, or more frequently if you are over 36 years old.  Skin check.  Lung cancer screening. You may have this screening every year starting at age 44 if you have a 30-pack-year history of smoking and currently smoke or have quit within the past 15 years.  Fecal occult blood test (FOBT) of the stool. You may have this test every year starting at age 81.  Flexible sigmoidoscopy or colonoscopy. You may have a sigmoidoscopy every 5 years or a colonoscopy every 10 years starting at age 39.  Prostate cancer screening. Recommendations will vary depending on your family history and other risks.  Hepatitis C blood test.  Hepatitis B blood test.  Sexually transmitted disease (STD) testing.  Diabetes screening. This is done by checking your blood sugar (glucose) after you have not eaten for a while (fasting). You may have this done every 1-3 years.  Abdominal aortic aneurysm (AAA) screening. You may need this if you are a current or former smoker.  Osteoporosis. You may be screened starting at age 26 if you are at high risk. Talk with your health care provider about your test results, treatment options, and if necessary, the need for more tests. Vaccines  Your health care provider may recommend certain vaccines, such as:  Influenza vaccine. This is recommended every year.  Tetanus, diphtheria, and acellular pertussis (Tdap, Td) vaccine. You may need a Td booster every 10 years.  Zoster vaccine. You may need this after age 11.  Pneumococcal 13-valent conjugate (PCV13) vaccine. One dose is recommended after age  6.  Pneumococcal polysaccharide (PPSV23) vaccine. One dose is recommended after age 69. Talk to your health care provider about which screenings and vaccines you need and how often you need them. This information is not intended to replace advice given to you by your health care provider. Make sure you discuss any questions you have with your health care provider. Document Released: 09/14/2015 Document Revised: 05/07/2016 Document Reviewed: 06/19/2015 Elsevier Interactive Patient Education  2017 [place] Prevention in the Home Falls can cause injuries. They can happen to people of all ages. There are many things you can do to make your home safe and to help prevent falls. What can I do on the outside of my home?  Regularly fix the edges of walkways and driveways and fix any cracks.  Remove anything that might make you trip as you walk through a door, such as a raised step or threshold.  Trim any bushes or trees on the path to your home.  Use bright outdoor lighting.  Clear any walking paths of anything that might make someone trip, such as rocks or tools.  Regularly check to see if handrails are loose or broken. Make sure that both sides of any steps have handrails.  Any raised decks and porches should have guardrails on the edges.  Have any leaves, snow, or ice cleared regularly.  Use sand or salt on walking paths during winter.  Clean up any spills in your garage right away. This includes oil or grease spills. What can I do in the bathroom?  Use night lights.  Install grab bars by the toilet and in the tub and shower. Do not use towel bars as grab bars.  Use non-skid mats or decals in the tub or shower.  If you need to sit down in the shower, use a plastic, non-slip stool.  Keep the floor dry. Clean up any water that spills on the floor as soon as it happens.  Remove soap buildup in the tub or shower regularly.  Attach bath mats securely with double-sided  non-slip rug tape.  Do not have throw rugs and other things on the floor that can make you trip. What can I do in the bedroom?  Use night lights.  Make sure that you have a light by your bed that is easy to reach.  Do not use any sheets or blankets that are too big for your bed. They should not hang down onto the floor.  Have a firm chair that has side arms. You can use this for support while you get dressed.  Do not have throw rugs and other things on the floor that can make you trip. What can I do in the kitchen?  Clean up any spills right away.  Avoid walking on wet floors.  Keep items that you use a lot in easy-to-reach places.  If you need to reach something above you, use a strong step stool that has a grab bar.  Keep electrical cords out of the way.  Do not use floor polish or wax that makes floors slippery. If you must use wax, use non-skid floor wax.  Do not have throw rugs and other things on the floor that can make you trip. What can I do  with my stairs?  Do not leave any items on the stairs.  Make sure that there are handrails on both sides of the stairs and use them. Fix handrails that are broken or loose. Make sure that handrails are as long as the stairways.  Check any carpeting to make sure that it is firmly attached to the stairs. Fix any carpet that is loose or worn.  Avoid having throw rugs at the top or bottom of the stairs. If you do have throw rugs, attach them to the floor with carpet tape.  Make sure that you have a light switch at the top of the stairs and the bottom of the stairs. If you do not have them, ask someone to add them for you. What else can I do to help prevent falls?  Wear shoes that:  Do not have high heels.  Have rubber bottoms.  Are comfortable and fit you well.  Are closed at the toe. Do not wear sandals.  If you use a stepladder:  Make sure that it is fully opened. Do not climb a closed stepladder.  Make sure that both  sides of the stepladder are locked into place.  Ask someone to hold it for you, if possible.  Clearly mark and make sure that you can see:  Any grab bars or handrails.  First and last steps.  Where the edge of each step is.  Use tools that help you move around (mobility aids) if they are needed. These include:  Canes.  Walkers.  Scooters.  Crutches.  Turn on the lights when you go into a dark area. Replace any light bulbs as soon as they burn out.  Set up your furniture so you have a clear path. Avoid moving your furniture around.  If any of your floors are uneven, fix them.  If there are any pets around you, be aware of where they are.  Review your medicines with your doctor. Some medicines can make you feel dizzy. This can increase your chance of falling. Ask your doctor what other things that you can do to help prevent falls. This information is not intended to replace advice given to you by your health care provider. Make sure you discuss any questions you have with your health care provider. Document Released: 06/14/2009 Document Revised: 01/24/2016 Document Reviewed: 09/22/2014 Elsevier Interactive Patient Education  2017 [employer].

## 2020-07-16 NOTE — Progress Notes (Signed)
Virtual Visit via Telephone Note  I connected with  Randy Rogers on 07/16/20 at 11:00 AM EST by telephone and verified that I am speaking with the correct person using two identifiers.  Medicare Annual Wellness visit completed telephonically due to Covid-19 pandemic.   Persons participating in this call: This Health Coach and this patient.   Location: Patient: Home Provider: Office   I discussed the limitations, risks, security and privacy concerns of performing an evaluation and management service by telephone and the availability of in person appointments. The patient expressed understanding and agreed to proceed.  Unable to perform video visit due to video visit attempted and failed and/or patient does not have video capability.   Some vital signs may be absent or patient reported.   Willette Brace, LPN   Subjective:   Randy Rogers is a 69 y.o. male who presents for Medicare Annual/Subsequent preventive examination.  Review of Systems     Cardiac Risk Factors include: advanced age (>76men, >38 women);dyslipidemia;male gender     Objective:    There were no vitals filed for this visit. There is no height or weight on file to calculate BMI.  Advanced Directives 07/16/2020 07/04/2019 06/02/2018 05/15/2016  Does Patient Have a Medical Advance Directive? Yes Yes Yes Yes  Type of Advance Directive Living will [place];Living will [place];Living will [place];Living will  Does patient want to make changes to medical advance directive? - No - Patient declined No - Patient declined -  Copy of [place] in Chart? - No - copy requested No - copy requested No - copy requested    Current Medications (verified) Outpatient Encounter Medications as of 07/16/2020  Medication Sig  . fexofenadine (ALLEGRA) 180 MG tablet Take 180 mg by mouth as needed. Reported on 09/24/2015  . FLUAD QUADRIVALENT 0.5 ML injection    . fluticasone (FLONASE) 50 MCG/ACT nasal spray Place 2 sprays into both nostrils daily.  Marland Kitchen ibuprofen (ADVIL,MOTRIN) 200 MG tablet Take 200 mg by mouth every 6 (six) hours as needed. Reported on 09/24/2015  . sildenafil (VIAGRA) 100 MG tablet   . [DISCONTINUED] sulfamethoxazole-trimethoprim (BACTRIM DS) 800-160 MG tablet Take 1 tablet by mouth 2 (two) times daily.   No facility-administered encounter medications on file as of 07/16/2020.    Allergies (verified) Seasonal ic [cholestatin]   History: Past Medical History:  Diagnosis Date  . Allergy    Past Surgical History:  Procedure Laterality Date  . COLONOSCOPY  2006; 03/11/2011   5 mm polyp removed; 59mm cecal polyp and diverticulosis  . KNEE ARTHROSCOPY     left   Family History  Problem Relation Age of Onset  . Tuberculosis Mother   . Emphysema Maternal Grandfather        smoker   Social History   Socioeconomic History  . Marital status: Soil scientist    Spouse name: Not on file  . Number of children: Not on file  . Years of education: Not on file  . Highest education level: Not on file  Occupational History  . Occupation: Retired   Tobacco Use  . Smoking status: Never Smoker  . Smokeless tobacco: Never Used  Vaping Use  . Vaping Use: Never used  Substance and Sexual Activity  . Alcohol use: Yes    Alcohol/week: 14.0 standard drinks    Types: 14 Standard drinks or equivalent per week  . Drug use: No  . Sexual activity: Yes  Other  Topics Concern  . Not on file  Social History Narrative   Divorced twice. Oldest daughter married to pharmD, homemaker( 1 grandson). Middle child (1 granddaughter)- lecturer/pharmD [employer] Youngest daughter premed as of 2020.       Adopted by grandparents.      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 Difficulty of  Paying Living Expenses: Not hard at all  Food Insecurity: No Food Insecurity  . Worried About Charity fundraiser in the Last Year: Never true  . Ran Out of Food in the Last Year: Never true  Transportation Needs: No Transportation Needs  . Lack of Transportation (Medical): No  . Lack of Transportation (Non-Medical): No  Physical Activity: Sufficiently Active  . Days of Exercise per Week: 7 days  . Minutes of Exercise per Session: 30 min  Stress: No Stress Concern Present  . Feeling of Stress : Not at all  Social Connections: Moderately Integrated  . Frequency of Communication with Friends and Family: Once a week  . Frequency of Social Gatherings with Friends and Family: More than three times a week  . Attends Religious Services: Never  . Active Member of Clubs or Organizations: Yes  . Attends Archivist Meetings: 1 to 4 times per year  . Marital Status: Living with partner    Tobacco Counseling Counseling given: Not Answered   Clinical Intake:  Pre-visit preparation completed: Yes  Pain : No/denies pain     BMI - recorded: 28 Nutritional Status: BMI 25 -29 Overweight Nutritional Risks: None Diabetes: No  How often do you need to have someone help you when you read instructions, pamphlets, or other written materials from your doctor or pharmacy?: 1 - Never  Diabetic?No  Interpreter Needed?: No  Information entered by :: Charlott Rakes, LPN   Activities of Daily Living In your present state of health, do you have any difficulty performing the following activities: 07/16/2020  Hearing? N  Vision? N  Difficulty concentrating or making decisions? N  Walking or climbing stairs? N  Dressing or bathing? N  Doing errands, shopping? N  Preparing Food and eating ? N  Using the Toilet? N  In the past six months, have you accidently leaked urine? N  Do you have problems with loss of bowel control? N  Managing your Medications? N  Managing your Finances? N   Housekeeping or managing your Housekeeping? N  Some recent data might be hidden    Patient Care Team: Marin Olp, MD as PCP - General (Family Medicine)  Indicate any recent Medical Services you may have received from other than [facility] providers in the past year (date may be approximate).     Assessment:   This is a routine wellness examination for Randy Rogers.  Hearing/Vision screen  Hearing Screening   125Hz  250Hz  500Hz  1000Hz  2000Hz  3000Hz  4000Hz  6000Hz  8000Hz   Right ear:           Left ear:           Comments: Denies any hearing at this time  Vision Screening Comments: Pt followed up on [address] for eye exams if problems  Dietary issues and exercise activities discussed: Current Exercise Habits: Home exercise routine, Type of exercise: walking, Time (Minutes): 30, Frequency (Times/Week): 7, Weekly Exercise (Minutes/Week): 210  Goals    . Exercise 150 min/wk Moderate Activity     Continue to stay active with travel and walking    . Patient Stated     Keep A1c lower and lose weight      Depression Screen PHQ 2/9 Scores 07/16/2020 07/04/2019 07/04/2019 06/02/2018 06/02/2018 04/16/2017  PHQ - 2 Score 0 0 0 0 0 0  PHQ- 9 Score - 0 - 0 - -    Fall Risk Fall Risk  07/16/2020 07/04/2019 06/02/2018 04/16/2017  Falls in the past year? 0 0 No Yes  Number falls in past yr: 0 - - 1  Injury with Fall? 0 0 - Yes  Risk for fall due to : Impaired vision - - -  Follow up Falls prevention discussed Falls evaluation completed;Education provided;Falls prevention discussed - -    Any stairs in or around the home? Yes  If so, are there any without handrails? No  Home free of loose throw rugs in walkways, pet beds, electrical cords, etc? Yes  Adequate lighting in your home to reduce risk of falls? Yes   ASSISTIVE DEVICES UTILIZED TO PREVENT FALLS:  Life alert? No  Use of a cane, walker or w/c? No  Grab bars in the bathroom? Yes  Shower chair or bench in shower? Yes  Elevated  toilet seat or a handicapped toilet? No   TIMED UP AND GO:  Was the test performed? No    Cognitive Function:     6CIT Screen 07/16/2020 07/04/2019 06/02/2018  What Year? 0 points 0 points 0 points  What month? 0 points 0 points 0 points  What time? - 0 points 0 points  Count back from 20 0 points 0 points 0 points  Months in reverse 0 points 0 points 0 points  Repeat phrase 0 points 0 points 0 points  Total Score - 0 0    Immunizations Immunization History  Administered Date(s) Administered  . Fluad Quad(high Dose 65+) 05/11/2019  . Influenza, High Dose Seasonal PF 05/05/2018  . Influenza,inj,Quad PF,6+ Mos 05/01/2015, 05/22/2016  . Influenza-Unspecified 06/01/2014, 05/14/2017, 04/27/2020  . PFIZER SARS-COV-2 Vaccination 10/10/2019, 11/03/2019, 05/25/2020  . Pneumococcal Conjugate-13 12/16/2016  . Pneumococcal Polysaccharide-23 12/30/2017  . Td 02/26/2007  . Tdap 11/07/2014  . Zoster 02/22/2013  . Zoster Recombinat (Shingrix) 03/10/2018, 06/24/2018    TDAP status: Up to date   Flu Vaccine status: Up to date  Done 04/27/20  Pneumococcal vaccine status: Up to date Covid-19 vaccine status: Completed vaccines  Qualifies for Shingles Vaccine? Yes   Zostavax completed Yes   Shingrix Completed?: Yes  Screening Tests Health Maintenance  Topic Date Due  . Hepatitis C Screening  09/21/2109 (Originally Jan 14, 1951)  . COLONOSCOPY  05/29/2021  . TETANUS/TDAP  11/06/2024  . INFLUENZA VACCINE  Completed  . COVID-19 Vaccine  Completed  . PNA vac Low Risk Adult  Completed    Health Maintenance  There are no preventive care reminders to display for this patient.  Colorectal cancer screening: Completed 05/29/16. Repeat every 5 years   Additional Screening:  Hepatitis C Screening: does qualify  Vision Screening: Recommended annual ophthalmology exams for early detection of glaucoma and other disorders of the eye. Is the patient up to date with their annual eye exam?  No   Who is the provider or what is the name of the office in which the patient attends annual eye exams? A Dr on [address] as needed  Dental Screening: Recommended annual dental exams for proper oral  hygiene  Community Resource Referral / Chronic Care Management: CRR required this visit?  No   CCM required this visit?  No      Plan:     I have personally reviewed and noted the following in the patient's chart:   . Medical and social history . Use of alcohol, tobacco or illicit drugs  . Current medications and supplements . Functional ability and status . Nutritional status . Physical activity . Advanced directives . List of other physicians . Hospitalizations, surgeries, and ER visits in previous 12 months . Vitals . Screenings to include cognitive, depression, and falls . Referrals and appointments  In addition, I have reviewed and discussed with patient certain preventive protocols, quality metrics, and best practice recommendations. A written personalized care plan for preventive services as well as general preventive health recommendations were provided to patient.     Willette Brace, LPN   [id number]   Nurse Notes: None

## 2020-09-11 NOTE — Patient Instructions (Addendum)
Please stop by lab before you go If you have mychart- we will send your results within 3 business days of [place] receiving them.  If you do not have mychart- we will call you about results within 5 business days of [place] receiving them.  *please also note that you will see labs on mychart as soon as they post. I will later go in and write notes on them- will say "notes from Dr. Yong Channel"  We will call you within two weeks about your referral to coronary calcium scoring. If you do not hear within 2 weeks, give [place] a call.   Recommended follow up: Return in about 1 year (around 09/13/2021) for physical or sooner if needed.

## 2020-09-11 NOTE — Progress Notes (Signed)
Phone: [phone]   Subjective:  Patient presents today for their annual physical. Chief complaint-noted.   See problem oriented charting- ROS- full  review of systems was completed and negative per full ROS sheet  The following were reviewed and entered/updated in epic: Past Medical History:  Diagnosis Date  . Allergy    Patient Active Problem List   Diagnosis Date Noted  . Hyperlipidemia 06/14/2014    Priority: Medium  . PAC (premature atrial contraction) 07/04/2019    Priority: Low  . Actinic keratosis 01/07/2010    Priority: Low  . ALLERGIC RHINITIS, SEASONAL 06/03/2007    Priority: Low  . 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 History of colonic polyps 03/04/2007    Priority: Low  . Syncope 04/16/2017   Past Surgical History:  Procedure Laterality Date  . COLONOSCOPY  2006; 03/11/2011   5 mm polyp removed; 34m cecal polyp and diverticulosis  . KNEE ARTHROSCOPY     left    Family History  Problem Relation Age of Onset  . Tuberculosis Mother   . Emphysema Maternal Grandfather        smoker    Medications- reviewed and updated Current Outpatient Medications  Medication Sig Dispense Refill  . fexofenadine (ALLEGRA) 180 MG tablet Take 180 mg by mouth as needed. Reported on 09/24/2015    . fluticasone (FLONASE) 50 MCG/ACT nasal spray Place 2 sprays into both nostrils daily. 16 g 3  . ibuprofen (ADVIL,MOTRIN) 200 MG tablet Take 200 mg by mouth every 6 (six) hours as needed. Reported on 09/24/2015    . sildenafil (VIAGRA) 100 MG tablet      No current facility-administered medications for this visit.    Allergies-reviewed and updated No Known Allergies  Social History   Social History Narrative   Divorced twice. Oldest daughter married to pharmD, homemaker( 1 grandson). Middle child (1 granddaughter)- lecturer/pharmD [employer] Youngest daughter premed as of 2020.       Adopted by grandparents.      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 Oropharynx normal Neck: no thyromegaly CV: RRR no murmurs rubs or gallops Lungs: CTAB no crackles, wheeze, rhonchi Abdomen: soft/nontender/nondistended/normal bowel sounds. No rebound or guarding.  Ext: no edema Skin: warm, dry Neuro: grossly normal, moves all extremities, PERRLA    Assessment and Plan  70y.o. male presenting for annual physical.  Health Maintenance counseling: 1. Anticipatory guidance: Patient counseled regarding regular dental exams -q6 months, eye exams -yearly or every other year,  avoiding smoking and second hand smoke, no illicit drugs, limiting alcohol to 2 beverages per day - 1.5 per day.   2. Risk factor reduction:  Advised patient of need for regular exercise and diet rich and fruits and vegetables to reduce risk of heart attack and stroke. Exercise-last year walking 4-9 miles daily . Diet-mapping his calories, carbs, alcohol intake thus the 1.5 per day above.  Down 15 pounds from last physical! Wt Readings from Last 3 Encounters:  09/13/20 183 lb 9.6 oz (83.3 kg)  08/19/19 195 lb 9.6 oz (88.7 kg)  08/08/19  198 lb (89.8 kg)  3. Immunizations/screenings/ancillary studies- fully up-to-date Immunization History  Administered Date(s) Administered  . Fluad Quad(high Dose 65+) 05/11/2019  . Influenza, High Dose Seasonal PF 05/05/2018  . Influenza,inj,Quad PF,6+ Mos 05/01/2015, 05/22/2016  . Influenza-Unspecified 06/01/2014, 05/14/2017, 04/27/2020  . PFIZER SARS-COV-2 Vaccination 10/10/2019, 11/03/2019, 05/25/2020  . Pneumococcal Conjugate-13 12/16/2016  . Pneumococcal Polysaccharide-23 12/30/2017  . Td 02/26/2007  . Tdap 11/07/2014  . Zoster 02/22/2013  . Zoster  Recombinat (Shingrix) 03/10/2018, 06/24/2018   4. Prostate cancer screening- patient with elevated PSA in 2021-referred to urology.  Ended up with biopsy in 10/2019 which was benign. Due for PSA today and we will try to send to urology Lab Results  Component Value Date   PSA 6.12 (H) 09/23/2019   PSA 6.83 (H) 08/02/2019   PSA 6.37 (H) 07/04/2019   5. Colon cancer screening - last done in 2017-history of colonic polyps with planned repeat May 21, 2021 6. Skin cancer screening-has seen dermatology in the past-most recently several years ago. advised regular sunscreen use. Denies worrisome, changing, or new skin lesions.  7.  Never smoker 8. STD screening - monogamous with partner  Status of chronic or acute concerns   #hyperlipidemia S: Medication:Patient not interested in taking statin even on once a week basis.  ASCVD risk at 15.2% based off last lipids in 2020-he has made some substantial lifestyle changes and lost significant weight  Lab Results  Component Value Date   CHOL 207 (H) 07/04/2019   HDL 59.10 07/04/2019   LDLCALC 136 (H) 07/04/2019   LDLDIRECT 141.4 01/10/2010   TRIG 63.0 07/04/2019   CHOLHDL 4 07/04/2019   A/P: Hopeful for improvement given weight loss and lifestyle changes. He owuld like to get more info with coronary calcium scoring- ordered today  #PACs-after syncopal episode August 2018-no issues since that time other than occasional palpitations.  Had MRI cardiac May 06, 2017.  Echocardiogram reassuring April 27, 2017.  Does get frequent PACs on EKG.  Syncopal episode ultimately was thought to be primarily vasovagal and not PAC related  -interestingly after antibiotics has had almost no issues/palpitations  Has done home A1c checks in the past and have been normal.  Last A1c with [place] was normal at 5.2 in 2017.he checks his a1c every 3 months- last 5.1- before that 5.3 [facility] kit. 101 sugar fasting he reports.   #Allergies- flonase daily from [facility] and also  allegra  # sildenafil from urology but has not had use  Recommended follow up: Return in about 1 year (around 09/13/2021) for physical or sooner if needed. Future Appointments  Date Time Provider [place]  08/05/2021 11:00 AM [facility]   Lab/Order associations: fasting   ICD-10-CM   1. Preventative health care  Z00.00 Lipid panel    CBC with Differential/Platelet    Comprehensive metabolic panel    PSA  2. Hyperlipidemia, unspecified hyperlipidemia type  E78.5 Lipid panel    CBC with Differential/Platelet    Comprehensive metabolic panel    CT CARDIAC SCORING (SELF PAY ONLY)  3. Elevated PSA  R97.20 PSA    No orders of the defined types were placed in this encounter.   Return precautions advised.  Garret Reddish, MD

## 2020-09-13 ENCOUNTER — Encounter: Payer: Self-pay | Admitting: Family Medicine

## 2020-09-13 ENCOUNTER — Other Ambulatory Visit: Payer: Self-pay

## 2020-09-13 ENCOUNTER — Ambulatory Visit (INDEPENDENT_AMBULATORY_CARE_PROVIDER_SITE_OTHER): Payer: Medicare HMO | Admitting: Family Medicine

## 2020-09-13 VITALS — BP 130/82 | HR 70 | Temp 98.6°F | Ht 69.5 in | Wt 183.6 lb

## 2020-09-13 DIAGNOSIS — E785 Hyperlipidemia, unspecified: Secondary | ICD-10-CM

## 2020-09-13 DIAGNOSIS — R972 Elevated prostate specific antigen [PSA]: Secondary | ICD-10-CM

## 2020-09-13 DIAGNOSIS — Z0000 Encounter for general adult medical examination without abnormal findings: Secondary | ICD-10-CM

## 2020-09-13 LAB — COMPREHENSIVE METABOLIC PANEL
ALT: 20 U/L (ref 0–53)
AST: 24 U/L (ref 0–37)
Albumin: 4.9 g/dL (ref 3.5–5.2)
Alkaline Phosphatase: 59 U/L (ref 39–117)
BUN: 19 mg/dL (ref 6–23)
CO2: 29 mEq/L (ref 19–32)
Calcium: 9.6 mg/dL (ref 8.4–10.5)
Chloride: 104 mEq/L (ref 96–112)
Creatinine, Ser: 1.01 mg/dL (ref 0.40–1.50)
GFR: 76.29 mL/min (ref >60.00)
Glucose, Bld: 102 mg/dL — ABNORMAL HIGH (ref 70–99)
Potassium: 4.6 mEq/L (ref 3.5–5.1)
Sodium: 139 mEq/L (ref 135–145)
Total Bilirubin: 0.9 mg/dL (ref 0.2–1.2)
Total Protein: 7.1 g/dL (ref 6.0–8.3)

## 2020-09-13 LAB — CBC WITH DIFFERENTIAL/PLATELET
Basophils Absolute: 0 10*3/uL (ref 0.0–0.1)
Basophils Relative: 0.7 % (ref 0.0–3.0)
Eosinophils Absolute: 0.1 10*3/uL (ref 0.0–0.7)
Eosinophils Relative: 1.7 % (ref 0.0–5.0)
HCT: 44.9 % (ref 39.0–52.0)
Hemoglobin: 15.2 g/dL (ref 13.0–17.0)
Lymphocytes Relative: 23.3 % (ref 12.0–46.0)
Lymphs Abs: 1.5 10*3/uL (ref 0.7–4.0)
MCHC: 33.8 g/dL (ref 30.0–36.0)
MCV: 92.7 fl (ref 78.0–100.0)
Monocytes Absolute: 0.5 10*3/uL (ref 0.1–1.0)
Monocytes Relative: 8 % (ref 3.0–12.0)
Neutro Abs: 4.2 10*3/uL (ref 1.4–7.7)
Neutrophils Relative %: 66.3 % (ref 43.0–77.0)
Platelets: 234 10*3/uL (ref 150.0–400.0)
RBC: 4.85 Mil/uL (ref 4.22–5.81)
RDW: 13.3 % (ref 11.5–15.5)
WBC: 6.4 10*3/uL (ref 4.0–10.5)

## 2020-09-13 LAB — LIPID PANEL
Cholesterol: 172 mg/dL (ref 0–200)
HDL: 57.9 mg/dL (ref >39.00)
LDL Cholesterol: 102 mg/dL — ABNORMAL HIGH (ref 0–99)
NonHDL: 113.83
Total CHOL/HDL Ratio: 3
Triglycerides: 57 mg/dL (ref 0.0–149.0)
VLDL: 11.4 mg/dL (ref 0.0–40.0)

## 2020-09-13 LAB — PSA: PSA: 9.87 ng/mL — ABNORMAL HIGH (ref 0.10–4.00)

## 2020-09-19 DIAGNOSIS — R972 Elevated prostate specific antigen [PSA]: Secondary | ICD-10-CM | POA: Diagnosis not present

## 2020-09-19 DIAGNOSIS — N400 Enlarged prostate without lower urinary tract symptoms: Secondary | ICD-10-CM | POA: Diagnosis not present

## 2020-09-19 LAB — PSA: PSA: 10

## 2020-09-21 ENCOUNTER — Other Ambulatory Visit: Payer: Self-pay | Admitting: Nurse Practitioner

## 2020-09-21 DIAGNOSIS — R972 Elevated prostate specific antigen [PSA]: Secondary | ICD-10-CM

## 2020-10-13 ENCOUNTER — Ambulatory Visit
Admission: RE | Admit: 2020-10-13 | Discharge: 2020-10-13 | Disposition: A | Discharge status: Home / Self Care | Payer: Medicare HMO | Source: Ambulatory Visit | Attending: Nurse Practitioner | Admitting: Nurse Practitioner

## 2020-10-13 DIAGNOSIS — N400 Enlarged prostate without lower urinary tract symptoms: Secondary | ICD-10-CM | POA: Diagnosis not present

## 2020-10-13 DIAGNOSIS — R972 Elevated prostate specific antigen [PSA]: Secondary | ICD-10-CM

## 2020-10-13 MED ORDER — GADOBENATE DIMEGLUMINE 529 MG/ML IV SOLN
17.0000 mL | Freq: Once | INTRAVENOUS | Status: AC | PRN
  Administered 2020-10-13: 17 mL via INTRAVENOUS

## 2020-10-24 DIAGNOSIS — R972 Elevated prostate specific antigen [PSA]: Secondary | ICD-10-CM | POA: Diagnosis not present

## 2020-11-23 DIAGNOSIS — C61 Malignant neoplasm of prostate: Secondary | ICD-10-CM | POA: Diagnosis not present

## 2020-11-23 DIAGNOSIS — R972 Elevated prostate specific antigen [PSA]: Secondary | ICD-10-CM | POA: Diagnosis not present

## 2020-12-03 ENCOUNTER — Other Ambulatory Visit (HOSPITAL_COMMUNITY): Payer: Self-pay | Admitting: Urology

## 2020-12-03 DIAGNOSIS — C61 Malignant neoplasm of prostate: Secondary | ICD-10-CM

## 2020-12-18 ENCOUNTER — Encounter (HOSPITAL_COMMUNITY)
Admission: RE | Admit: 2020-12-18 | Discharge: 2020-12-18 | Disposition: A | Discharge status: Home / Self Care | Payer: Medicare HMO | Source: Ambulatory Visit | Attending: Urology | Admitting: Urology

## 2020-12-18 ENCOUNTER — Other Ambulatory Visit: Payer: Self-pay

## 2020-12-18 DIAGNOSIS — C61 Malignant neoplasm of prostate: Secondary | ICD-10-CM | POA: Diagnosis not present

## 2020-12-18 DIAGNOSIS — N281 Cyst of kidney, acquired: Secondary | ICD-10-CM | POA: Diagnosis not present

## 2020-12-18 DIAGNOSIS — Q8909 Congenital malformations of spleen: Secondary | ICD-10-CM | POA: Diagnosis not present

## 2020-12-18 DIAGNOSIS — M47816 Spondylosis without myelopathy or radiculopathy, lumbar region: Secondary | ICD-10-CM | POA: Diagnosis not present

## 2020-12-18 MED ORDER — TECHNETIUM TC 99M MEDRONATE IV KIT
21.1000 | PACK | Freq: Once | INTRAVENOUS | Status: AC
  Administered 2020-12-18: 21.1 via INTRAVENOUS

## 2020-12-24 DIAGNOSIS — C61 Malignant neoplasm of prostate: Secondary | ICD-10-CM | POA: Diagnosis not present

## 2021-01-01 ENCOUNTER — Encounter: Payer: Self-pay | Admitting: Radiation Oncology

## 2021-01-01 ENCOUNTER — Ambulatory Visit
Admission: RE | Admit: 2021-01-01 | Discharge: 2021-01-01 | Disposition: A | Discharge status: Home / Self Care | Payer: Medicare HMO | Source: Ambulatory Visit | Attending: Radiation Oncology | Admitting: Radiation Oncology

## 2021-01-01 ENCOUNTER — Other Ambulatory Visit: Payer: Self-pay

## 2021-01-01 VITALS — Ht 70.0 in | Wt 181.2 lb

## 2021-01-01 DIAGNOSIS — Z8546 Personal history of malignant neoplasm of prostate: Secondary | ICD-10-CM | POA: Insufficient documentation

## 2021-01-01 DIAGNOSIS — C61 Malignant neoplasm of prostate: Secondary | ICD-10-CM

## 2021-01-01 HISTORY — DX: Malignant neoplasm of prostate: C61

## 2021-01-01 NOTE — Progress Notes (Signed)
Radiation Oncology         [phone] ________________________________  Initial outpatient Consultation - Conducted via telephone due to current COVID-19 concerns for limiting patient exposure  Name: Randy Rogers MRN: [id number]  Date: 01/01/2021  DOB: 1951-02-23  [id number], Randy Mars, MD  Franchot Gallo, MD   REFERRING PHYSICIAN: Franchot Gallo, MD  DIAGNOSIS: 70 y.o. gentleman with Stage T1c adenocarcinoma of the prostate with Gleason score of 4+4, and PSA of 10.    ICD-10-CM   1. Malignant neoplasm of prostate ([place])  C61     HISTORY OF PRESENT ILLNESS: Randy Rogers is a 70 y.o. male with a diagnosis of prostate cancer. He has a history of elevated PSA since at least 2014 when his PSA was 4.46.  His PSA fluctuated back into the normal range for several years but was noted elevated at 6.83 in 08/2019 and he was treated for prostatitis with a decrease in the PSA down to 6.12 despite antibiotics prescribed by his primary care physician, Dr. Yong Channel.  Accordingly, he was referred for evaluation in urology by Dr. Diona Fanti on 10/14/19. He proceeded to prostate biopsy on 11/25/19 where all 12 cores were benign.  He continued to monitor the PSA with his PCP and the PSA was noted further elevated at 9.87 in 09/13/2020 so he was referred back to urology for further evaluation. The PSA was repeated 09/19/20, after a week of abstinence but remained elevated at 10. Digital rectal examination was benign. This prompted a prostate MRI on 10/13/20 showing a 2.3 cm PI-RADS 3 lesion of the left anterior and posterior transitional zone in the mid gland and base.  The prostate volume was estimated at 51.7 cc. The patient proceeded to MRI fusion biopsy of the prostate on 11/23/20.  The prostate volume measured 54.6 cc by ultrasound.  Out of 16 core biopsies, 5 were positive.  The maximum Gleason score was 4+4, and this was seen in the left mid. Additionally, Gleason 4+3 was seen in two ROI MRI lesion samples, as  well as Gleason 3+4 in the left apex (small focus), and Gleason 3+3 in the left apex lateral.  He proceeded to staging CT and bone scan on 12/18/20. CT A/P was without any findings suspicious for metastatic disease. Bone scan was also negative for osseous metastatic disease.  The patient reviewed the biopsy results with his urologist and he has kindly been referred today for discussion of potential radiation treatment options.   PREVIOUS RADIATION THERAPY: No  PAST MEDICAL HISTORY:  Past Medical History:  Diagnosis Date  . Allergy   . Prostate cancer ([place])       PAST SURGICAL HISTORY: Past Surgical History:  Procedure Laterality Date  . COLONOSCOPY  2006; 03/11/2011   5 mm polyp removed; 106mm cecal polyp and diverticulosis  . KNEE ARTHROSCOPY     left  . PROSTATE BIOPSY      FAMILY HISTORY:  Family History  Problem Relation Age of Onset  . Tuberculosis Mother   . Emphysema Maternal Grandfather        smoker  . Breast cancer Neg Hx   . Prostate cancer Neg Hx   . Colon cancer Neg Hx   . Pancreatic cancer Neg Hx     SOCIAL HISTORY:  Social History   Socioeconomic History  . Marital status: Soil scientist    Spouse name: Not on file  . Number of children: 3  . Years of education: Not on file  . Highest education level: Not  on file  Occupational History  . Occupation: Retired   Tobacco Use  . Smoking status: Never Smoker  . Smokeless tobacco: Never Used  Vaping Use  . Vaping Use: Never used  Substance and Sexual Activity  . Alcohol use: Yes    Alcohol/week: 14.0 standard drinks    Types: 14 Standard drinks or equivalent per week  . Drug use: No  . Sexual activity: Yes  Other Topics Concern  . Not on file  Social History Narrative   Divorced twice. Oldest daughter married to pharmD, homemaker( 1 grandson). Middle child (1 granddaughter)- lecturer/pharmD [employer] Youngest daughter premed as of 2020.       Adopted by grandparents.      PHD.  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 Difficulty of Paying Living Expenses: Not hard at all  Food Insecurity: No Food Insecurity  . Worried About Charity fundraiser in the Last Year: Never true  . Ran Out of Food in the Last Year: Never true  Transportation Needs: No Transportation Needs  . Lack of Transportation (Medical): No  . Lack of Transportation (Non-Medical): No  Physical Activity: Sufficiently Active  . Days of Exercise per Week: 7 days  . Minutes of Exercise per Session: 30 min  Stress: No Stress Concern Present  . Feeling of Stress : Not at all  Social Connections: Moderately Integrated  . Frequency of Communication with Friends and Family: Once a week  . Frequency of Social Gatherings with Friends and Family: More than three times a week  . Attends Religious Services: Never  . Active Member of Clubs or Organizations: Yes  . Attends Archivist Meetings: 1 to 4 times per year  . Marital Status: Living with partner  Intimate Partner Violence: Not At Risk  . Fear of Current or Ex-Partner: No  . Emotionally Abused: No  . Physically Abused: No  . Sexually Abused: No    ALLERGIES: Patient has no known allergies.  MEDICATIONS:  Current Outpatient Medications  Medication Sig Dispense Refill  . fexofenadine (ALLEGRA) 180 MG tablet Take 180 mg by mouth as needed. Reported on 09/24/2015    . fluticasone (FLONASE) 50 MCG/ACT nasal spray Place 2 sprays into both nostrils daily. 16 g 3  . ibuprofen (ADVIL,MOTRIN) 200 MG tablet Take 200 mg by mouth every 6 (six) hours as needed. Reported on 09/24/2015     No current facility-administered medications for this encounter.    REVIEW OF SYSTEMS:  On review of systems, the patient reports that he is doing well overall. He denies any chest pain, shortness of breath, cough, fevers,  chills, night sweats, unintended weight changes. He denies any bowel disturbances, and denies abdominal pain, nausea or vomiting. He denies any new musculoskeletal or joint aches or pains. His IPSS was 1, indicating minimal urinary symptoms. His SHIM was 24, indicating he does not have erectile dysfunction. A complete review of systems is obtained and is otherwise negative.    PHYSICAL EXAM:  Wt Readings from Last 3 Encounters:  01/01/21 181 lb 3.2 oz (82.2 kg)  09/13/20 183 lb 9.6 oz (83.3 kg)  08/19/19 195 lb 9.6 oz (88.7 kg)   Temp Readings from Last 3 Encounters:  09/13/20 98.6 F (37 C) (Temporal)  08/19/19 97.6 F (36.4 C) (Temporal)  08/08/19 (!) 97.5 F (36.4 C) (Temporal)   BP Readings from Last 3 Encounters:  09/13/20 130/82  08/19/19 138/76  07/04/19 132/68   Pulse Readings from Last 3 Encounters:  09/13/20 70  08/19/19 68  07/04/19 68   Pain Assessment Pain Score: 0-No pain/10  Physical exam not performed in light telephone consult visit format.   KPS = 100  100 - Normal; no complaints; no evidence of disease. 90   - Able to carry on normal activity; minor signs or symptoms of disease. 80   - Normal activity with effort; some signs or symptoms of disease. 13   - Cares for self; unable to carry on normal activity or to do active work. 60   - Requires occasional assistance, but is able to care for most of his personal needs. 50   - Requires considerable assistance and frequent medical care. 68   - Disabled; requires special care and assistance. 30   - Severely disabled; hospital admission is indicated although death not imminent. 43   - Very sick; hospital admission necessary; active supportive treatment necessary. 10   - Moribund; fatal processes progressing rapidly. 0     - Dead  Karnofsky DA, Abelmann [place], Craver LS and Burchenal [facility] [phone]) The use of the nitrogen mustards in the palliative treatment of carcinoma: with particular reference to bronchogenic  carcinoma Cancer 1 634-56  LABORATORY DATA:  Lab Results  Component Value Date   WBC 6.4 09/13/2020   HGB 15.2 09/13/2020   HCT 44.9 09/13/2020   MCV 92.7 09/13/2020   PLT 234.0 09/13/2020   Lab Results  Component Value Date   NA 139 09/13/2020   K 4.6 09/13/2020   CL 104 09/13/2020   CO2 29 09/13/2020   Lab Results  Component Value Date   ALT 20 09/13/2020   AST 24 09/13/2020   ALKPHOS 59 09/13/2020   BILITOT 0.9 09/13/2020     RADIOGRAPHY: NM Bone Scan Whole Body  Result Date: 12/18/2020 CLINICAL DATA:  Prostate cancer EXAM: NUCLEAR MEDICINE WHOLE BODY BONE SCAN TECHNIQUE: Whole body anterior and posterior images were obtained approximately 3 hours after intravenous injection of radiopharmaceutical. RADIOPHARMACEUTICALS:  21.1 mCi Technetium-7m MDP IV COMPARISON:  None Correlation: CT abdomen and pelvis 12/18/2020 FINDINGS: Uptake at shoulders, sternoclavicular joints, knees, feet, typically degenerative. No worrisome sites of abnormal tracer accumulation to suggest osseous metastases. Expected urinary tract and soft tissue distribution of tracer. IMPRESSION: No scintigraphic evidence of osseous metastatic disease. Electronically Signed   By: Lavonia Dana M.D.   On: 12/18/2020 16:38      IMPRESSION/PLAN: This visit was conducted via telephone to spare the patient unnecessary potential exposure in the healthcare setting during the current COVID-19 pandemic. 1. 71 y.o. gentleman with Stage T1c adenocarcinoma of the prostate with Gleason Score of 4+4, and PSA of 10. We discussed the patient's workup and outlined the nature of prostate cancer in this setting. The patient's T stage, Gleason's score, and PSA put him into the high risk group. Accordingly, he is eligible for a variety of potential treatment options including prostatectomy or LT-ADT in combination with either 8 weeks of external radiation or 5 weeks of external radiation with an upfront brachytherapy boost. We discussed the  available radiation techniques, and focused on the details and logistics of delivery. We discussed and outlined the risks, benefits, short and long-term effects associated with radiotherapy and compared and contrasted these with prostatectomy. We discussed the role of SpaceOAR gel in reducing the  rectal toxicity associated with radiotherapy. We also detailed the role of ADT in the treatment of high risk prostate cancer and outlined the associated side effects that could be expected with this therapy. He appears to have a good understanding of his disease and our treatment recommendations which are of curative intent.  He was encouraged to ask questions that were answered to his stated satisfaction.  At the conclusion of our conversation, the patient is interested in moving forward with brachytherapy boost with SpaceOAR gel placement followed by a 5 week course of daily radiotherapy, concurrent with LT-ADT. He has not yet received his first Lupron injection so we will share our discussion with Dr. Jeffie Pollock and help to coordinate a follow up visit to start ADT now.  We discussed the rationale behind the intentional delay in starting radiotherapy approximately 2 months after initiating ADT to allow for the radiosensitizing effects of this treatment.  He is comfortable and in agreement with this plan so we will move forward with coordinating a pre-seed CT [facility] planning appointment approximately 2 months after starting ADT in preparation for the brachytherapy boost procedure which will be followed by 5 weeks of daily external beam radiation to begin approximately 3 weeks after the procedure.  The patient will be contacted by Romie Jumper in our office who will be working closely with him to coordinate OR scheduling and pre and post procedure appointments.  We will contact the pharmaceutical rep to ensure that Bracken is available at the time of procedure.  We enjoyed meeting hm today and look forward to continuing to  participate in his care. He knows that he is welcome to call at any time with any questions or concerns in the interim.  Given current concerns for patient exposure during the COVID-19 pandemic, this encounter was conducted via telephone. The patient was notified in advance and was offered a [place] meeting to allow for face to face communication but unfortunately reported that he did not have the appropriate resources/technology to support such a visit and instead preferred to proceed with telephone consult. The patient has given verbal consent for this type of encounter. The time spent during this encounter was 45 minutes. The attendants for this meeting include Tyler Pita MD, Ashlyn Bruning PA-C, [place], patient [name]. During the encounter, Tyler Pita MD, Ashlyn Bruning PA-C, and scribe, Wilburn Mylar were located at [facility].  Patient Orvan Papadakis was located at home.    Nicholos Johns, PA-C    Tyler Pita, MD  [facility] Direct Dial: [phone]  Fax: [phone] [email or web address]  Skype  LinkedIn   This document serves as a record of services personally performed by Tyler Pita, MD and Freeman Caldron, PA-C. It was created on their behalf by Wilburn Mylar, a trained medical scribe. The creation of this record is based on the scribe's personal observations and the provider's statements to them. This document has been checked and approved by the attending provider.

## 2021-01-01 NOTE — Progress Notes (Signed)
GU Location of Tumor / Histology: prostatic adenocarcinoma  If Prostate Cancer, Gleason Score is (4 + 4) and PSA is (10). Prostate volume: 54.5 g  Biopsies of prostate (if applicable) revealed:   Past/Anticipated interventions by urology, if any: initial biopsy 2021 (negative), repeat prostate biopsy, CT abd/pelvis (small bone island left iliac bone), bone scan (negative), referral to Dr. Tammi Klippel for consideration of radiation therapy  Patient has not received ADT yet.  Past/Anticipated interventions by medical oncology, if any: no  Weight changes, if any: denies  Bowel/Bladder complaints, if any: IPSS 1. SHIM 24. Denies dysuria or hematuria. Denies urinary leakage or incontinence. Denies any bowel complaints.    Nausea/Vomiting, if any: denies  Pain issues, if any:  denies  SAFETY ISSUES:  Prior radiation? denies  Pacemaker/ICD? denies  Possible current pregnancy? no, male patient  Is the patient on methotrexate? denies  Current Complaints / other details:  70 year old male.

## 2021-01-02 ENCOUNTER — Telehealth: Payer: Self-pay | Admitting: *Deleted

## 2021-01-02 NOTE — Telephone Encounter (Signed)
CALLED PATIENT TO INFORM OF ADT APPT. FOR 01-18-21- ARRIVALTIME- 1:15 PM @ [facility], SPOKE WITH PATIENT AND HE IS AWARE OF THIS APPT.

## 2021-01-18 DIAGNOSIS — Z5111 Encounter for antineoplastic chemotherapy: Secondary | ICD-10-CM | POA: Diagnosis not present

## 2021-01-18 DIAGNOSIS — C61 Malignant neoplasm of prostate: Secondary | ICD-10-CM | POA: Diagnosis not present

## 2021-01-25 ENCOUNTER — Telehealth: Payer: Self-pay | Admitting: *Deleted

## 2021-01-25 NOTE — Telephone Encounter (Signed)
Called patient to ask questions, spoke with patient 

## 2021-02-06 ENCOUNTER — Other Ambulatory Visit: Payer: Self-pay | Admitting: Urology

## 2021-02-07 ENCOUNTER — Telehealth: Payer: Self-pay | Admitting: *Deleted

## 2021-02-07 NOTE — Telephone Encounter (Signed)
CALLED PATIENT TO INFORM OF PRE-SEED APPTS. FOR 04-04-21 AND HIS IMPLANT ON 04-30-21, SPOKE WITH PATIENT AND HE IS AWARE OF THESE APPTS.

## 2021-02-20 DIAGNOSIS — Z5111 Encounter for antineoplastic chemotherapy: Secondary | ICD-10-CM | POA: Diagnosis not present

## 2021-02-20 DIAGNOSIS — C61 Malignant neoplasm of prostate: Secondary | ICD-10-CM | POA: Diagnosis not present

## 2021-03-20 DIAGNOSIS — Z5111 Encounter for antineoplastic chemotherapy: Secondary | ICD-10-CM | POA: Diagnosis not present

## 2021-03-20 DIAGNOSIS — C61 Malignant neoplasm of prostate: Secondary | ICD-10-CM | POA: Diagnosis not present

## 2021-04-03 ENCOUNTER — Telehealth: Payer: Self-pay | Admitting: *Deleted

## 2021-04-03 NOTE — Telephone Encounter (Signed)
CALLED PATIENT TO REMIND OF PRE-SEED APPTS. FOR 04-04-21, SPOKE WITH PATIENT AND HE IS AWARE OF THESE APPTS.

## 2021-04-04 ENCOUNTER — Encounter: Payer: Self-pay | Admitting: Urology

## 2021-04-04 ENCOUNTER — Ambulatory Visit
Admission: RE | Admit: 2021-04-04 | Discharge: 2021-04-04 | Disposition: A | Discharge status: Home / Self Care | Payer: Medicare HMO | Source: Ambulatory Visit | Attending: Radiation Oncology | Admitting: Radiation Oncology

## 2021-04-04 ENCOUNTER — Ambulatory Visit
Admission: RE | Admit: 2021-04-04 | Discharge: 2021-04-04 | Disposition: A | Discharge status: Home / Self Care | Payer: Medicare HMO | Source: Ambulatory Visit | Attending: Urology | Admitting: Urology

## 2021-04-04 ENCOUNTER — Encounter (HOSPITAL_COMMUNITY)
Admission: RE | Admit: 2021-04-04 | Discharge: 2021-04-04 | Disposition: A | Discharge status: Home / Self Care | Payer: Medicare HMO | Source: Ambulatory Visit | Attending: Urology | Admitting: Urology

## 2021-04-04 ENCOUNTER — Ambulatory Visit (HOSPITAL_COMMUNITY)
Admission: RE | Admit: 2021-04-04 | Discharge: 2021-04-04 | Disposition: A | Discharge status: Home / Self Care | Payer: Medicare HMO | Source: Ambulatory Visit | Attending: Urology | Admitting: Urology

## 2021-04-04 ENCOUNTER — Other Ambulatory Visit: Payer: Self-pay

## 2021-04-04 DIAGNOSIS — C61 Malignant neoplasm of prostate: Secondary | ICD-10-CM | POA: Diagnosis not present

## 2021-04-04 DIAGNOSIS — Z510 Encounter for antineoplastic radiation therapy: Secondary | ICD-10-CM | POA: Diagnosis not present

## 2021-04-04 DIAGNOSIS — Z01818 Encounter for other preprocedural examination: Secondary | ICD-10-CM | POA: Diagnosis not present

## 2021-04-04 NOTE — Progress Notes (Signed)
  Radiation Oncology         [phone]) [phone] ________________________________  Name: Elworth Laubenstein MRN: [id number]  Date: 04/04/2021  DOB: 10/08/1950  SIMULATION AND TREATMENT PLANNING NOTE PUBIC ARCH STUDY  [id number], [name], MD  Franchot Gallo, MD  DIAGNOSIS: 70 y.o. gentleman with Stage T1c adenocarcinoma of the prostate with Gleason score of 4+4, and PSA of 10.  Oncology History  Malignant neoplasm of prostate ([place])  11/23/2020 Cancer Staging   Staging form: Prostate, AJCC 8th Edition - Clinical stage from 11/23/2020: Stage IIC (cT1c, cN0, cM0, PSA: 10, Grade Group: 4) - Signed by Freeman Caldron, PA-C on 01/01/2021  Histopathologic type: Adenocarcinoma, NOS  Stage prefix: Initial diagnosis  Prostate specific antigen (PSA) range: 10 to 19  Gleason primary pattern: 4  Gleason secondary pattern: 4  Gleason score: 8  Histologic grading system: 5 grade system  Number of biopsy cores examined: 16  Number of biopsy cores positive: 5  Location of positive needle core biopsies: One side    01/01/2021 Initial Diagnosis   Malignant neoplasm of prostate ([place])        ICD-10-CM   1. Malignant neoplasm of prostate ([place])  C61       COMPLEX SIMULATION:  The patient presented today for evaluation for possible prostate seed implant. He was brought to the radiation planning suite and placed supine on the CT couch. A 3-dimensional image study set was obtained in upload to the planning computer. There, on each axial slice, I contoured the prostate gland. Then, using three-dimensional radiation planning tools I reconstructed the prostate in view of the structures from the transperineal needle pathway to assess for possible pubic arch interference. In doing so, I did not appreciate any pubic arch interference. Also, the patient's prostate volume was estimated based on the drawn structure. The volume was 39 cc after ADT.  Given the pubic arch appearance and prostate volume, patient remains a  good candidate to proceed with prostate seed implant. Today, he freely provided informed written consent to proceed.    PLAN: The patient will undergo prostate seed implant for boost to be followed by 5 weeks of prostate IMRT.   ________________________________  Sheral Apley. Tammi Klippel, M.D.

## 2021-04-22 ENCOUNTER — Telehealth: Payer: Self-pay | Admitting: *Deleted

## 2021-04-22 DIAGNOSIS — C61 Malignant neoplasm of prostate: Secondary | ICD-10-CM | POA: Diagnosis not present

## 2021-04-22 NOTE — Telephone Encounter (Signed)
Called patient to remind of labs for Friday 04-26-21, spoke with patient and he is aware of this appt.

## 2021-04-24 ENCOUNTER — Encounter: Payer: Self-pay | Admitting: Family Medicine

## 2021-04-25 ENCOUNTER — Other Ambulatory Visit: Payer: Self-pay

## 2021-04-25 ENCOUNTER — Encounter (HOSPITAL_BASED_OUTPATIENT_CLINIC_OR_DEPARTMENT_OTHER): Payer: Self-pay | Admitting: Urology

## 2021-04-25 NOTE — Progress Notes (Signed)
Spoke w/ via phone for pre-op interview--- Pt Lab needs dos----   no            Lab results------ pt getting lab done 04-26-2021, CBC/ CMP/ PT/ PTT;  current ekg / cxr in epic/ chart COVID test -----patient states asymptomatic no test needed Arrive at ------- 1015 on 04-30-2021 NPO after MN NO Solid Food.  Clear liquids from MN until--- 0915 Med rec completed Medications to take morning of surgery ----- NONE Diabetic medication ----- n/a Patient instructed no nail polish to be worn day of surgery Patient instructed to bring photo id and insurance card day of surgery Patient aware to have Driver (ride ) / caregiver  for 24 hours after surgery -- sig other, Lucille Passy Patient Special Instructions ----- will do one fleet enema morning of surgery Pre-Op special Istructions ----- n/a Patient verbalized understanding of instructions that were given at this phone interview. Patient denies shortness of breath, chest pain, fever, cough at this phone interview.

## 2021-04-26 ENCOUNTER — Encounter (HOSPITAL_COMMUNITY)
Admission: RE | Admit: 2021-04-26 | Discharge: 2021-04-26 | Disposition: A | Discharge status: Home / Self Care | Payer: Medicare HMO | Source: Ambulatory Visit | Attending: Urology | Admitting: Urology

## 2021-04-26 DIAGNOSIS — Z01812 Encounter for preprocedural laboratory examination: Secondary | ICD-10-CM | POA: Insufficient documentation

## 2021-04-26 LAB — PROTIME-INR
INR: 1 (ref 0.8–1.2)
Prothrombin Time: 13 seconds (ref 11.4–15.2)

## 2021-04-26 LAB — COMPREHENSIVE METABOLIC PANEL
ALT: 26 U/L (ref 0–44)
AST: 29 U/L (ref 15–41)
Albumin: 4.5 g/dL (ref 3.5–5.0)
Alkaline Phosphatase: 62 U/L (ref 38–126)
Anion gap: 6 (ref 5–15)
BUN: 21 mg/dL (ref 8–23)
CO2: 28 mmol/L (ref 22–32)
Calcium: 9.8 mg/dL (ref 8.9–10.3)
Chloride: 106 mmol/L (ref 98–111)
Creatinine, Ser: 0.87 mg/dL (ref 0.61–1.24)
GFR, Estimated: >60 mL/min (ref >60)
Glucose, Bld: 96 mg/dL (ref 70–99)
Potassium: 4.4 mmol/L (ref 3.5–5.1)
Sodium: 140 mmol/L (ref 135–145)
Total Bilirubin: 1.1 mg/dL (ref 0.3–1.2)
Total Protein: 7.5 g/dL (ref 6.5–8.1)

## 2021-04-26 LAB — CBC
HCT: 46.5 % (ref 39.0–52.0)
Hemoglobin: 15.4 g/dL (ref 13.0–17.0)
MCH: 31.4 pg (ref 26.0–34.0)
MCHC: 33.1 g/dL (ref 30.0–36.0)
MCV: 94.9 fL (ref 80.0–100.0)
Platelets: 285 10*3/uL (ref 150–400)
RBC: 4.9 MIL/uL (ref 4.22–5.81)
RDW: 12.6 % (ref 11.5–15.5)
WBC: 5.7 10*3/uL (ref 4.0–10.5)
nRBC: 0 % (ref 0.0–0.2)

## 2021-04-26 LAB — APTT: aPTT: 30 seconds (ref 24–36)

## 2021-04-29 ENCOUNTER — Telehealth: Payer: Self-pay | Admitting: *Deleted

## 2021-04-29 NOTE — Op Note (Signed)
I have prostate cancer.     3.25.2022: fusion TRUS/Bx. Recent MRI--volume 52 mL. PI RADS 3 lesion23 x 17 x 11 mm Lt anterior and posterior transition zone in mid and basal gland. No other abnormalities noted. Prostate volume 54.6 mL, PSA 10.0. PSAD 0.18.  ROI--4 cores were taken, 2 were benign, 2 revealed GG3 PCA in 30% of each core.  3/12 Systematic cores + for adenocarcinoma-- 1 revealing GG1 , 1 GG 2 in GG 4 PCa   4.19.2022: CT abdomen and pelvis with contrast revealed no evidence of lymphadenopathy or intra-abdominal metastatic disease. Small bone island seen in the left iliac bone.   Bone scan revealed no evidence of osseous metastatic disease.   04/22/2021  Patient has received [place] and Eligard. Eligard injection was 7/20. He returns for preoperative assessment for brachytherapy and spaceoar that is scheduled with Dr. Jeffie Pollock. He is planning to follow that up with external radiation.    ALLERGIES: None   MEDICATIONS: Levofloxacin 750 mg tablet 1 tablet PO Morning of procedure  Viagra 100 mg tablet 1 tablet PO prn  Zyrtec 10 mg tablet  Fexofenadine Hcl 180 mg tablet  Ibuprofen 400 mg tablet  Levofloxacin 750 mg tablet 1 tablet PO morning of procedure     GU PSH: Locm 300-'399Mg'$ /Ml Iodine,1Ml - 12/18/2020 Prostate Needle Biopsy - 11/23/2020, 11/25/2019     NON-GU PSH: Surgical Pathology, Gross And Microscopic Examination For Prostate Needle - 11/23/2020, 11/25/2019     GU PMH: Prostate Cancer - 03/20/2021, - 02/20/2021, - 01/18/2021, New diagnosis of GG 4 prostate cancer. Nomogram predictions: organ confined disease -43% Lymph nodes/seminal vesicle involvement --11/9% respectively Progression free probability after prostatectomy at 5/10 years -- 48/33% respectively IPSS 0 QoL score o SHIM score 23, - 12/24/2020 Elevated PSA - 12/18/2020, - 11/23/2020, elevated PSA, history of prior biopsies that were negative, increasing PSA trend and 1 suspicious area on recent MRI, - 10/24/2020, -  09/19/2020, - 11/25/2019, His PSA has remained stably elevated after a full course of bactrim. Prostate exam is normal (perhaps with some slight asymmetry) with no "bogginess" on todays exam. I do think it would be the appropriate next step to have him return for an [place] bx. , - 2021 BPH w/o LUTS - 09/19/2020 ED due to arterial insufficiency, He speculates that this is mostly due to some anxiety he has. - 2021    NON-GU PMH: None   FAMILY HISTORY: Tuberculosis - Mother   SOCIAL HISTORY: Marital Status: Unknown Preferred Language: English; Ethnicity: Not Hispanic Or Latino; Race: White Current Smoking Status: Patient has never smoked.   Tobacco Use Assessment Completed: Used Tobacco in last 30 days? Does not use smokeless tobacco. Does not use drugs. Drinks 4+ caffeinated drinks per day. Has not had a blood transfusion.    REVIEW OF SYSTEMS:    GU Review Male:   Patient denies hard to postpone urination, frequent urination, penile pain, get up at night to urinate, burning/ pain with urination, have to strain to urinate , trouble starting your stream, leakage of urine, stream starts and stops, and erection problems.  Gastrointestinal (Upper):   Patient denies nausea, vomiting, and indigestion/ heartburn.  Gastrointestinal (Lower):   Patient denies diarrhea and constipation.  Constitutional:   Patient reports night sweats. Patient denies fever and fatigue.  Skin:   Patient denies skin rash/ lesion and itching.  Eyes:   Patient denies blurred vision and double vision.  Ears/ Nose/ Throat:   Patient denies sore throat and sinus problems.  Hematologic/Lymphatic:   Patient denies swollen glands and easy bruising.  Cardiovascular:   Patient denies leg swelling and chest pains.  Respiratory:   Patient denies cough and shortness of breath.  Endocrine:   Patient denies excessive thirst.  Musculoskeletal:   Patient denies back pain and joint pain.  Neurological:   Patient denies headaches and  dizziness.  Psychologic:   Patient denies depression and anxiety.   VITAL SIGNS:      04/22/2021 02:56 PM  BP 136/82 mmHg  Pulse 71 /min  Temperature 97.8 F / 36.5 C   MULTI-SYSTEM PHYSICAL EXAMINATION:    Constitutional: Well-nourished. No physical deformities. Normally developed. Good grooming.  Respiratory: No labored breathing, no use of accessory muscles. Clear to auscultation bilaterally  Cardiovascular: Normal temperature, normal extremity pulses, no swelling, no varicosities.  Skin: No paleness, no jaundice, no cyanosis. No lesion, no ulcer, no rash.  Neurologic / Psychiatric: Oriented to time, oriented to place, oriented to person. No depression, no anxiety, no agitation.  Gastrointestinal: No mass, no tenderness, no rigidity, non obese abdomen.  Eyes: Normal conjunctivae. Normal eyelids.  Musculoskeletal: Normal gait and station of head and neck.     Complexity of Data:  Source Of History:  Patient  Records Review:   Previous Doctor Records, Previous Patient Records  Urine Test Review:   Urinalysis   09/19/20 09/13/20 08/02/19  PSA  Total PSA 10.00 ng/mL 9.87 ng/ml 6.83 ng/dl  Free PSA 0.76 ng/mL    % Free PSA 8 % PSA      PROCEDURES:          Urinalysis w/Scope Dipstick Dipstick Cont'd Micro  Color: Yellow Bilirubin: Neg mg/dL WBC/hpf: NS (Not Seen)  Appearance: Cloudy Ketones: Neg mg/dL RBC/hpf: NS (Not Seen)  Specific Gravity: 1.015 Blood: Neg ery/uL Bacteria: NS (Not Seen)  pH: 7.5 Protein: Neg mg/dL Cystals: Amorph Phosphates  Glucose: Neg mg/dL Urobilinogen: 0.2 mg/dL Casts: NS (Not Seen)    Nitrites: Neg Trichomonas: Not Present    Leukocyte Esterase: Neg leu/uL Mucous: Not Present      Epithelial Cells: NS (Not Seen)      Yeast: NS (Not Seen)      Sperm: Not Present    ASSESSMENT:      ICD-10 Details  1 GU:   Prostate Cancer - C61 Chronic, Stable   PLAN:           Orders Labs Urine Culture          Document Letter(s):  Created for Patient:  Clinical Summary         Notes:   proceed with brachytherapy with spaceoar. Risks and benefits discussed. All questions answered.        Next Appointment:      Next Appointment: 04/30/2021 12:00 PM    Appointment Type: Surgery     Location: [facility] [phone]    Provider: Irine Seal, M.D.    Reason for Visit: OP [place]

## 2021-04-29 NOTE — Telephone Encounter (Signed)
CALLED PATIENT TO REMIND OF PROCEDURE FOR 04-30-21, SPOKE WITH PATIENT AND HE IS AWARE OF THIS PROCEDURE

## 2021-04-30 ENCOUNTER — Ambulatory Visit (HOSPITAL_BASED_OUTPATIENT_CLINIC_OR_DEPARTMENT_OTHER): Payer: Medicare HMO | Admitting: Anesthesiology

## 2021-04-30 ENCOUNTER — Encounter (HOSPITAL_BASED_OUTPATIENT_CLINIC_OR_DEPARTMENT_OTHER): Payer: Self-pay | Admitting: Urology

## 2021-04-30 ENCOUNTER — Encounter (HOSPITAL_BASED_OUTPATIENT_CLINIC_OR_DEPARTMENT_OTHER)
Admission: RE | Disposition: A | Discharge status: Home / Self Care | Payer: Self-pay | Source: Ambulatory Visit | Attending: Urology

## 2021-04-30 ENCOUNTER — Ambulatory Visit (HOSPITAL_COMMUNITY): Payer: Medicare HMO

## 2021-04-30 ENCOUNTER — Other Ambulatory Visit: Payer: Self-pay

## 2021-04-30 ENCOUNTER — Ambulatory Visit (HOSPITAL_BASED_OUTPATIENT_CLINIC_OR_DEPARTMENT_OTHER)
Admission: RE | Admit: 2021-04-30 | Discharge: 2021-04-30 | Disposition: A | Discharge status: Home / Self Care | Payer: Medicare HMO | Source: Ambulatory Visit | Attending: Urology | Admitting: Urology

## 2021-04-30 DIAGNOSIS — E785 Hyperlipidemia, unspecified: Secondary | ICD-10-CM | POA: Diagnosis not present

## 2021-04-30 DIAGNOSIS — C61 Malignant neoplasm of prostate: Secondary | ICD-10-CM | POA: Diagnosis not present

## 2021-04-30 DIAGNOSIS — J302 Other seasonal allergic rhinitis: Secondary | ICD-10-CM | POA: Diagnosis not present

## 2021-04-30 DIAGNOSIS — M1990 Unspecified osteoarthritis, unspecified site: Secondary | ICD-10-CM | POA: Diagnosis not present

## 2021-04-30 HISTORY — DX: Personal history of other specified conditions: Z87.898

## 2021-04-30 HISTORY — DX: Personal history of adenomatous and serrated colon polyps: Z86.0101

## 2021-04-30 HISTORY — PX: SPACE OAR INSTILLATION: SHX6769

## 2021-04-30 HISTORY — DX: Atrial premature depolarization: I49.1

## 2021-04-30 HISTORY — DX: Personal history of urinary calculi: Z87.442

## 2021-04-30 HISTORY — DX: Male erectile dysfunction, unspecified: N52.9

## 2021-04-30 HISTORY — PX: CYSTOSCOPY: SHX5120

## 2021-04-30 HISTORY — DX: Other seasonal allergic rhinitis: J30.2

## 2021-04-30 HISTORY — PX: RADIOACTIVE SEED IMPLANT: SHX5150

## 2021-04-30 HISTORY — DX: Personal history of colonic polyps: Z86.010

## 2021-04-30 HISTORY — DX: Unspecified osteoarthritis, unspecified site: M19.90

## 2021-04-30 HISTORY — DX: Presence of spectacles and contact lenses: Z97.3

## 2021-04-30 SURGERY — INSERTION, RADIATION SOURCE, PROSTATE
Anesthesia: General | Site: Rectum

## 2021-04-30 MED ORDER — CIPROFLOXACIN IN D5W 400 MG/200ML IV SOLN
INTRAVENOUS | Status: AC
  Filled 2021-04-30: qty 200

## 2021-04-30 MED ORDER — PROPOFOL 10 MG/ML IV BOLUS
INTRAVENOUS | Status: AC
  Filled 2021-04-30: qty 20

## 2021-04-30 MED ORDER — SODIUM CHLORIDE 0.9 % IV SOLN: 250.0000 mL | INTRAVENOUS | Status: DC | PRN

## 2021-04-30 MED ORDER — MIDAZOLAM HCL 2 MG/2ML IJ SOLN
INTRAMUSCULAR | Status: AC
  Filled 2021-04-30: qty 2

## 2021-04-30 MED ORDER — CIPROFLOXACIN IN D5W 400 MG/200ML IV SOLN
400.0000 mg | INTRAVENOUS | Status: AC
  Administered 2021-04-30: 400 mg via INTRAVENOUS

## 2021-04-30 MED ORDER — SODIUM CHLORIDE (PF) 0.9 % IJ SOLN
INTRAMUSCULAR | Status: DC | PRN
  Administered 2021-04-30: 10 mL

## 2021-04-30 MED ORDER — OXYCODONE HCL 5 MG/5ML PO SOLN
5.0000 mg | Freq: Once | ORAL | Status: DC | PRN
Start: 2021-04-30 — End: 2021-04-30

## 2021-04-30 MED ORDER — ACETAMINOPHEN 325 MG RE SUPP: 650.0000 mg | RECTAL | Status: DC | PRN

## 2021-04-30 MED ORDER — MORPHINE SULFATE (PF) 4 MG/ML IV SOLN: 2.0000 mg | INTRAVENOUS | Status: DC | PRN

## 2021-04-30 MED ORDER — HYDROCODONE-ACETAMINOPHEN 5-325 MG PO TABS: 1.0000 | ORAL_TABLET | Freq: Four times a day (QID) | ORAL | 0 refills | Status: DC | PRN

## 2021-04-30 MED ORDER — ACETAMINOPHEN 325 MG PO TABS: 650.0000 mg | ORAL_TABLET | ORAL | Status: DC | PRN

## 2021-04-30 MED ORDER — STERILE WATER FOR IRRIGATION IR SOLN
Status: DC | PRN
  Administered 2021-04-30: 500 mL

## 2021-04-30 MED ORDER — MIDAZOLAM HCL 5 MG/5ML IJ SOLN
INTRAMUSCULAR | Status: DC | PRN
Start: 2021-04-30 — End: 2021-04-30
  Administered 2021-04-30: 2 mg via INTRAVENOUS

## 2021-04-30 MED ORDER — FENTANYL CITRATE (PF) 100 MCG/2ML IJ SOLN: 25.0000 ug | INTRAMUSCULAR | Status: DC | PRN

## 2021-04-30 MED ORDER — OXYCODONE HCL 5 MG PO TABS: 5.0000 mg | ORAL_TABLET | ORAL | Status: DC | PRN

## 2021-04-30 MED ORDER — SODIUM CHLORIDE 0.9% FLUSH: 3.0000 mL | Freq: Two times a day (BID) | INTRAVENOUS | Status: DC

## 2021-04-30 MED ORDER — LIDOCAINE HCL (PF) 2 % IJ SOLN
INTRAMUSCULAR | Status: AC
  Filled 2021-04-30: qty 5

## 2021-04-30 MED ORDER — IOHEXOL 300 MG/ML  SOLN
INTRAMUSCULAR | Status: DC | PRN
  Administered 2021-04-30: 7 mL

## 2021-04-30 MED ORDER — SODIUM CHLORIDE 0.9% FLUSH: 3.0000 mL | INTRAVENOUS | Status: DC | PRN

## 2021-04-30 MED ORDER — ONDANSETRON HCL 4 MG/2ML IJ SOLN
INTRAMUSCULAR | Status: DC | PRN
  Administered 2021-04-30: 4 mg via INTRAVENOUS

## 2021-04-30 MED ORDER — LIDOCAINE 2% (20 MG/ML) 5 ML SYRINGE
INTRAMUSCULAR | Status: DC | PRN
  Administered 2021-04-30: 80 mg via INTRAVENOUS

## 2021-04-30 MED ORDER — AMISULPRIDE (ANTIEMETIC) 5 MG/2ML IV SOLN: 10.0000 mg | Freq: Once | INTRAVENOUS | Status: DC | PRN

## 2021-04-30 MED ORDER — FLEET ENEMA 7-19 GM/118ML RE ENEM: 1.0000 | ENEMA | Freq: Once | RECTAL | Status: DC

## 2021-04-30 MED ORDER — FENTANYL CITRATE (PF) 100 MCG/2ML IJ SOLN
INTRAMUSCULAR | Status: AC
  Filled 2021-04-30: qty 2

## 2021-04-30 MED ORDER — DEXAMETHASONE SODIUM PHOSPHATE 10 MG/ML IJ SOLN
INTRAMUSCULAR | Status: AC
  Filled 2021-04-30: qty 1

## 2021-04-30 MED ORDER — LACTATED RINGERS IV SOLN: INTRAVENOUS | Status: DC

## 2021-04-30 MED ORDER — PROPOFOL 10 MG/ML IV BOLUS
INTRAVENOUS | Status: DC | PRN
  Administered 2021-04-30: 160 mg via INTRAVENOUS

## 2021-04-30 MED ORDER — MORPHINE BOLUS VIA INFUSION
2.0000 mg | INTRAVENOUS | Status: DC | PRN
  Filled 2021-04-30: qty 2

## 2021-04-30 MED ORDER — ONDANSETRON HCL 4 MG/2ML IJ SOLN: 4.0000 mg | Freq: Once | INTRAMUSCULAR | Status: DC | PRN

## 2021-04-30 MED ORDER — FENTANYL CITRATE (PF) 100 MCG/2ML IJ SOLN
INTRAMUSCULAR | Status: DC | PRN
  Administered 2021-04-30: 25 ug via INTRAVENOUS
  Administered 2021-04-30: 50 ug via INTRAVENOUS
  Administered 2021-04-30: 25 ug via INTRAVENOUS

## 2021-04-30 MED ORDER — DEXAMETHASONE SODIUM PHOSPHATE 10 MG/ML IJ SOLN
INTRAMUSCULAR | Status: DC | PRN
Start: 2021-04-30 — End: 2021-04-30
  Administered 2021-04-30: 10 mg via INTRAVENOUS

## 2021-04-30 MED ORDER — OXYCODONE HCL 5 MG PO TABS: 5.0000 mg | ORAL_TABLET | Freq: Once | ORAL | Status: DC | PRN

## 2021-04-30 MED ORDER — ONDANSETRON HCL 4 MG/2ML IJ SOLN
INTRAMUSCULAR | Status: AC
  Filled 2021-04-30: qty 2

## 2021-04-30 SURGICAL SUPPLY — 40 items
BAG DRN RND TRDRP ANRFLXCHMBR (UROLOGICAL SUPPLIES) ×3
BAG URINE DRAIN 2000ML AR STRL (UROLOGICAL SUPPLIES) ×4 IMPLANT
BLADE CLIPPER SENSICLIP SURGIC (BLADE) ×4 IMPLANT
CATH FOLEY 2WAY SLVR  5CC 16FR (CATHETERS) ×4
CATH FOLEY 2WAY SLVR 5CC 16FR (CATHETERS) ×3 IMPLANT
CATH ROBINSON RED A/P 16FR (CATHETERS) IMPLANT
CATH ROBINSON RED A/P 20FR (CATHETERS) ×4 IMPLANT
CLOTH BEACON ORANGE TIMEOUT ST (SAFETY) ×4 IMPLANT
CNTNR URN SCR LID CUP LEK RST (MISCELLANEOUS) ×6 IMPLANT
CONT SPEC 4OZ STRL OR WHT (MISCELLANEOUS) ×8
COVER BACK TABLE 60X90IN (DRAPES) ×4 IMPLANT
COVER MAYO STAND STRL (DRAPES) ×4 IMPLANT
DRAPE C-ARM 35X43 STRL (DRAPES) ×4 IMPLANT
DRSG TEGADERM 4X4.75 (GAUZE/BANDAGES/DRESSINGS) ×4 IMPLANT
DRSG TEGADERM 8X12 (GAUZE/BANDAGES/DRESSINGS) ×8 IMPLANT
GAUZE SPONGE 4X4 12PLY STRL (GAUZE/BANDAGES/DRESSINGS) ×4 IMPLANT
GLOVE SURG ENC MOIS LTX SZ6.5 (GLOVE) ×4 IMPLANT
GLOVE SURG ENC MOIS LTX SZ7.5 (GLOVE) IMPLANT
GLOVE SURG ENC MOIS LTX SZ8 (GLOVE) IMPLANT
GLOVE SURG ORTHO LTX SZ8.5 (GLOVE) ×4 IMPLANT
GLOVE SURG POLYISO LF SZ8 (GLOVE) ×8 IMPLANT
GLOVE SURG UNDER POLY LF SZ6.5 (GLOVE) IMPLANT
GOWN STRL REUS W/TWL LRG LVL3 (GOWN DISPOSABLE) ×4 IMPLANT
GOWN STRL REUS W/TWL XL LVL3 (GOWN DISPOSABLE) ×4 IMPLANT
HOLDER FOLEY CATH W/STRAP (MISCELLANEOUS) ×4 IMPLANT
I-SEED AGX100 ×244 IMPLANT
IMPL SPACEOAR VUE SYSTEM (Spacer) ×3 IMPLANT
IMPLANT SPACEOAR VUE SYSTEM (Spacer) ×4 IMPLANT
IV NS 1000ML (IV SOLUTION) ×4
IV NS 1000ML BAXH (IV SOLUTION) ×3 IMPLANT
KIT TURNOVER CYSTO (KITS) ×4 IMPLANT
MARKER SKIN DUAL TIP RULER LAB (MISCELLANEOUS) ×4 IMPLANT
PACK CYSTO (CUSTOM PROCEDURE TRAY) ×4 IMPLANT
SURGILUBE 2OZ TUBE FLIPTOP (MISCELLANEOUS) IMPLANT
SUT BONE WAX W31G (SUTURE) IMPLANT
SYR 10ML LL (SYRINGE) IMPLANT
TOWEL OR 17X26 10 PK STRL BLUE (TOWEL DISPOSABLE) ×4 IMPLANT
UNDERPAD 30X36 HEAVY ABSORB (UNDERPADS AND DIAPERS) ×8 IMPLANT
WATER STERILE IRR 3000ML UROMA (IV SOLUTION) ×4 IMPLANT
WATER STERILE IRR 500ML POUR (IV SOLUTION) ×4 IMPLANT

## 2021-04-30 NOTE — Anesthesia Procedure Notes (Signed)
Procedure Name: LMA Insertion Date/Time: 04/30/2021 12:29 PM Performed by: Whitaker Holderman D, CRNA Pre-anesthesia Checklist: Patient identified, Emergency Drugs available, Suction available and Patient being monitored Patient Re-evaluated:Patient Re-evaluated prior to induction Oxygen Delivery Method: Circle system utilized Preoxygenation: Pre-oxygenation with 100% oxygen Induction Type: IV induction Ventilation: Mask ventilation without difficulty LMA: LMA inserted LMA Size: 5.0 Tube type: Oral Number of attempts: 1 Placement Confirmation: positive ETCO2 and breath sounds checked- equal and bilateral Tube secured with: Tape Dental Injury: Teeth and Oropharynx as per pre-operative assessment

## 2021-04-30 NOTE — Progress Notes (Signed)
  Radiation Oncology         [phone] ________________________________  Name: Randy Rogers MRN: [id number]  Date: 04/30/2021  DOB: 01/16/1951       Prostate Seed Implant  [id number], [name], MD  No ref. provider found  DIAGNOSIS:  70 y.o. gentleman with Stage T1c adenocarcinoma of the prostate with Gleason score of 4+4, and PSA of 10.  Oncology History  Malignant neoplasm of prostate ([place])  11/23/2020 Cancer Staging   Staging form: Prostate, AJCC 8th Edition - Clinical stage from 11/23/2020: Stage IIC (cT1c, cN0, cM0, PSA: 10, Grade Group: 4) - Signed by Freeman Caldron, PA-C on 01/01/2021 Histopathologic type: Adenocarcinoma, NOS Stage prefix: Initial diagnosis Prostate specific antigen (PSA) range: 10 to 19 Gleason primary pattern: 4 Gleason secondary pattern: 4 Gleason score: 8 Histologic grading system: 5 grade system Number of biopsy cores examined: 16 Number of biopsy cores positive: 5 Location of positive needle core biopsies: One side   01/01/2021 Initial Diagnosis   Malignant neoplasm of prostate ([facility])     No diagnosis found.  PROCEDURE: Insertion of radioactive I-125 seeds into the prostate gland.  RADIATION DOSE: 110 Gy, boost therapy.  TECHNIQUE: Judge Decaro was brought to the operating room with the urologist. He was placed in the dorsolithotomy position. He was catheterized and a rectal tube was inserted. The perineum was shaved, prepped and draped. The ultrasound probe was then introduced into the rectum to see the prostate gland.  TREATMENT DEVICE: A needle grid was attached to the ultrasound probe stand and anchor needles were placed.  3D PLANNING: The prostate was imaged in 3D using a sagittal sweep of the prostate probe. These images were transferred to the planning computer. There, the prostate, urethra and rectum were defined on each axial reconstructed image. Then, the software created an optimized 3D plan and a few seed positions were adjusted. The  quality of the plan was reviewed using [facility] information for the target and the following two organs at risk:  Urethra and Rectum.  Then the accepted plan was printed and handed off to the radiation therapist.  Under my supervision, the custom loading of the seeds and spacers was carried out and loaded into sealed vicryl sleeves.  These pre-loaded needles were then placed into the needle holder.Marland Kitchen  PROSTATE VOLUME STUDY:  Using transrectal ultrasound the volume of the prostate was verified to be 37.1 cc.  SPECIAL TREATMENT PROCEDURE/SUPERVISION AND HANDLING: The pre-loaded needles were then delivered under sagittal guidance. A total of 19 needles were used to deposit 61 seeds in the prostate gland. The individual seed activity was 0.360 mCi.  SpaceOAR:  Yes  COMPLEX SIMULATION: At the end of the procedure, an anterior radiograph of the pelvis was obtained to document seed positioning and count. Cystoscopy was performed to check the urethra and bladder.  MICRODOSIMETRY: At the end of the procedure, the patient was emitting 0.086 mR/hr at 1 meter. Accordingly, he was considered safe for hospital discharge.  PLAN: The patient will return to the radiation oncology clinic for post implant CT dosimetry in three weeks.   ________________________________  Sheral Apley Tammi Klippel, M.D.

## 2021-04-30 NOTE — Discharge Instructions (Signed)
PROSTATE CANCER TREATMENT WITH RADIOACTIVE IODINE-125 SEED IMPLANT  This instruction sheet is intended to discuss implantation of Iodine-125 seeds as treatment for cancer of the prostate. It will explain in detail what you may expect from this treatment and what precautions are necessary as a result of the treatment. Iodine-125 emits a relatively low energy radiation. The radioactive seeds are surgically implanted directly into the prostate gland. Most of the radiation is contained within the prostate gland. A very small amount is present outside the body.The precautions that we ask you to take are to ensure that those around you are protected from unnecessary radiation. The principles of radiation safety that you need to understand are:  DISTANCE: The further a person is from the radioactive implant the less radiation they will be receiving. The amount of radiation received falls off quite rapidly with distance. More specific guidelines are given in the table on the last page.  TIME: The amount of radiation a person is exposed to is directly proportional to the amount of time that is spent in close proximity to the radioactive implant. Very little radiation will be received during short periods. See the table on the last page for more specific guideline.  CHILDREN UNDER AGE 26 Children should not be allowed to sit on your lap or otherwise be in very close contact for more than a few minutes for the first 6-8 weeks following the implant. You may affectionately greet (hug/kiss) a child for a short period of time, but remember, the longer you are in close proximity with that child the more radiation they are being exposed to. At a distance of 6 feet there is no limit to the length of time you may spend together. See specific guidelines on the last page.  PREGNANT OR POSSIBLY PREGNANT WOMEN Pregnant women should avoid prolonged close physical contact with you for the first 6-8 weeks after implant. At a  distance of 6 feet there is no limit to the length of time you may spend together. Pregnant women or possibly pregnant women can safely be in close contact with you for a limited period of time. See the last page for guidelines.  FAMILY RELATIONS You may sleep in the same bed as your partner (provided she is not pregnant or under the age of 60). Sexual intercourse, using a condom, may be resumed 2 weeks after the implant. Your semen may be discolored, dark brown or black. This is normal and is the result of bleeding that may have occurred during the implant. After 3-4 weeks it will not be necessary to use a condom.  DAILY ACTIVITIES You may resume normal activities in a few days (example: work, shopping, church) without the risk of harmful radiation exposure to those around you provided you keep in mind the time and distance precautions. Objects that you touch or item that you use do not become radioactive. Linens, clothing, tableware, and dishes may be used by other persons without special precautions. Your bodily wastes (urine and stool) are not radioactive.  SPECIAL PRECAUTIONS It is possible to lose implanted Iodine-125 seed(s) through urination. Although it is possible to pass seeds indefinitely, it is most likely to occur immediately after catheter removal. To prevent this from happening the catheter that was in place during the implant procedure is removed immediately after the implant and a cystoscopy procedure is performed. The process of removing the catheter and the cystoscopy procedure should dislodge and remove any seeds that are not firmly imbedded in the prostate  tissue. However, you should watch for seeds if/when you remove your catheter at home. The seeds are silver colored and the size of a grain of rice. In the unlikely event that a seed is seen after urination, simply flush the seed down the toilet. The seed should not be handled with your fingers, not even with a glove or napkin. A  spoon or tweezers can be used to pick up a seed. The Radiation Oncology department is open Monday - Friday from 8:00 am to 5:30 pm with a Radiation Oncologist on call at all times. He or she may be reached by calling [phone]. If you are to be hospitalized or if death should occur, your family should notify the Runner, broadcasting/film/video.  SIDE EFFECTS There are very few side effects associate with the implant procedure. Minor burning with urination, weak stream, hesitancy, intermittency, frequency, mild pain or feeling unable to pass your urine freely are common and usually stop in one to four months. If these symptoms are extremely uncomfortable, contact your physician.  RADIATION SAFETY GUIDELINES PROSTATE CANCER TREATMENT WITH RADIOACTIVE IODINE-125 SEED IMPLANT  The following guidelines will limit exposure to less than naturally occurring background radiation.  PERSONS AGE 59-45 (if able to become pregnant)  FOR 8 WEEKS FOLLOWING IMPLANT  At a distance of 1 foot: limit time to less than 2 hours/week At a distance of 3 feet: limit time to 20 hours/week At a distance of 6 feet: no restrictions  AFTER 8 WEEKS No restrictions  CHILDREN UNDER AGE 59, PREGNANT WOMEN OR POSSIBLY PREGNANT WOMEN  FOR 8 WEEKS FOLLOWING IMPLANT At a distance of 1 foot: limit time to 10 minutes/week At a distance of 3 feet: limit time to 2 hours/week At a distance of 6 feet: no restrictions  AFTER 8 WEEKS No restrictions  PERSONS OVER THE AGE OF 45 AND DO NOT EXPECT TO HAVE ANY MORE CHILDREN No restrictions  Updated by [facility] in January 2020  Post Anesthesia Home Care Instructions  Activity: Get plenty of rest for the remainder of the day. A responsible individual must stay with you for 24 hours following the procedure.  For the next 24 hours, DO NOT: -Drive a car -Paediatric nurse -Drink alcoholic beverages -Take any medication unless instructed by your physician -Make any legal decisions or  sign important papers.  Meals: Start with liquid foods such as gelatin or soup. Progress to regular foods as tolerated. Avoid greasy, spicy, heavy foods. If nausea and/or vomiting occur, drink only clear liquids until the nausea and/or vomiting subsides. Call your physician if vomiting continues.  Special Instructions/Symptoms: Your throat may feel dry or sore from the anesthesia or the breathing tube placed in your throat during surgery. If this causes discomfort, gargle with warm salt water. The discomfort should disappear within 24 hours.

## 2021-04-30 NOTE — Transfer of Care (Signed)
Immediate Anesthesia Transfer of Care Note  Patient: Randy Rogers  Procedure(s) Performed: RADIOACTIVE SEED IMPLANT/BRACHYTHERAPY IMPLANT (Prostate) SPACE OAR INSTILLATION (Rectum) CYSTOSCOPY FLEXIBLE (Bladder)  Patient Location: PACU  Anesthesia Type:General  Level of Consciousness: awake, alert  and oriented  Airway & Oxygen Therapy: Patient Spontanous Breathing and Patient connected to nasal cannula oxygen  Post-op Assessment: Report given to RN and Post -op Vital signs reviewed and stable  Post vital signs: Reviewed and stable  Last Vitals:  Vitals Value Taken Time  BP 130/69 04/30/21 1335  Temp    Pulse 51 04/30/21 1336  Resp 11 04/30/21 1336  SpO2 96 % 04/30/21 1336  Vitals shown include unvalidated device data.  Last Pain:  Vitals:   04/30/21 1026  TempSrc: Oral  PainSc: 0-No pain      Patients Stated Pain Goal: 5 ([id number])  Complications: No notable events documented.

## 2021-04-30 NOTE — Anesthesia Preprocedure Evaluation (Signed)
Anesthesia Evaluation  Patient identified by MRN, date of birth, ID band Patient awake    Reviewed: Allergy & Precautions, NPO status , Patient's Chart, lab work & pertinent test results  History of Anesthesia Complications Negative for: history of anesthetic complications  Airway Mallampati: III  TM Distance: >3 FB Neck ROM: Full    Dental  (+) Dental Advisory Given, Teeth Intact   Pulmonary neg pulmonary ROS,    Pulmonary exam normal        Cardiovascular Normal cardiovascular exam  HLD   Neuro/Psych negative neurological ROS     GI/Hepatic negative GI ROS, Neg liver ROS,   Endo/Other  negative endocrine ROS  Renal/GU negative Renal ROS   PROSTATE CANCER    Musculoskeletal negative musculoskeletal ROS (+)   Abdominal   Peds  Hematology negative hematology ROS (+)   Anesthesia Other Findings   Reproductive/Obstetrics                             Anesthesia Physical Anesthesia Plan  ASA: 2  Anesthesia Plan: General   Post-op Pain Management:    Induction: Intravenous  PONV Risk Score and Plan: 2 and Ondansetron, Dexamethasone, Midazolam and Treatment may vary due to age or medical condition  Airway Management Planned: LMA  Additional Equipment: None  Intra-op Plan:   Post-operative Plan: Extubation in OR  Informed Consent: I have reviewed the patients History and Physical, chart, labs and discussed the procedure including the risks, benefits and alternatives for the proposed anesthesia with the patient or authorized representative who has indicated his/her understanding and acceptance.     Dental advisory given  Plan Discussed with:   Anesthesia Plan Comments:         Anesthesia Quick Evaluation

## 2021-04-30 NOTE — H&P (Signed)
I have prostate cancer.     3.25.2022: fusion TRUS/Bx. Recent MRI--volume 52 mL. PI RADS 3 lesion23 x 17 x 11 mm Lt anterior and posterior transition zone in mid and basal gland. No other abnormalities noted. Prostate volume 54.6 mL, PSA 10.0. PSAD 0.18.  ROI--4 cores were taken, 2 were benign, 2 revealed GG3 PCA in 30% of each core.  3/12 Systematic cores + for adenocarcinoma-- 1 revealing GG1 , 1 GG 2 in GG 4 PCa   4.19.2022: CT abdomen and pelvis with contrast revealed no evidence of lymphadenopathy or intra-abdominal metastatic disease. Small bone island seen in the left iliac bone.   Bone scan revealed no evidence of osseous metastatic disease.   04/22/2021  Patient has received [place] and Eligard. Eligard injection was 7/20. He returns for preoperative assessment for brachytherapy and spaceoar that is scheduled with Dr. Jeffie Pollock. He is planning to follow that up with external radiation.    ALLERGIES: None   MEDICATIONS: Levofloxacin 750 mg tablet 1 tablet PO Morning of procedure  Viagra 100 mg tablet 1 tablet PO prn  Zyrtec 10 mg tablet  Fexofenadine Hcl 180 mg tablet  Ibuprofen 400 mg tablet  Levofloxacin 750 mg tablet 1 tablet PO morning of procedure     GU PSH: Locm 300-'399Mg'$ /Ml Iodine,1Ml - 12/18/2020 Prostate Needle Biopsy - 11/23/2020, 11/25/2019     NON-GU PSH: Surgical Pathology, Gross And Microscopic Examination For Prostate Needle - 11/23/2020, 11/25/2019     GU PMH: Prostate Cancer - 03/20/2021, - 02/20/2021, - 01/18/2021, New diagnosis of GG 4 prostate cancer. Nomogram predictions: organ confined disease -43% Lymph nodes/seminal vesicle involvement --11/9% respectively Progression free probability after prostatectomy at 5/10 years -- 48/33% respectively IPSS 0 QoL score o SHIM score 23, - 12/24/2020 Elevated PSA - 12/18/2020, - 11/23/2020, elevated PSA, history of prior biopsies that were negative, increasing PSA trend and 1 suspicious area on recent MRI, - 10/24/2020, -  09/19/2020, - 11/25/2019, His PSA has remained stably elevated after a full course of bactrim. Prostate exam is normal (perhaps with some slight asymmetry) with no "bogginess" on todays exam. I do think it would be the appropriate next step to have him return for an [place] bx. , - 2021 BPH w/o LUTS - 09/19/2020 ED due to arterial insufficiency, He speculates that this is mostly due to some anxiety he has. - 2021    NON-GU PMH: None   FAMILY HISTORY: Tuberculosis - Mother   SOCIAL HISTORY: Marital Status: Unknown Preferred Language: English; Ethnicity: Not Hispanic Or Latino; Race: White Current Smoking Status: Patient has never smoked.   Tobacco Use Assessment Completed: Used Tobacco in last 30 days? Does not use smokeless tobacco. Does not use drugs. Drinks 4+ caffeinated drinks per day. Has not had a blood transfusion.    REVIEW OF SYSTEMS:    GU Review Male:   Patient denies hard to postpone urination, frequent urination, penile pain, get up at night to urinate, burning/ pain with urination, have to strain to urinate , trouble starting your stream, leakage of urine, stream starts and stops, and erection problems.  Gastrointestinal (Upper):   Patient denies nausea, vomiting, and indigestion/ heartburn.  Gastrointestinal (Lower):   Patient denies diarrhea and constipation.  Constitutional:   Patient reports night sweats. Patient denies fever and fatigue.  Skin:   Patient denies skin rash/ lesion and itching.  Eyes:   Patient denies blurred vision and double vision.  Ears/ Nose/ Throat:   Patient denies sore throat and sinus problems.  Hematologic/Lymphatic:   Patient denies swollen glands and easy bruising.  Cardiovascular:   Patient denies leg swelling and chest pains.  Respiratory:   Patient denies cough and shortness of breath.  Endocrine:   Patient denies excessive thirst.  Musculoskeletal:   Patient denies back pain and joint pain.  Neurological:   Patient denies headaches and  dizziness.  Psychologic:   Patient denies depression and anxiety.   VITAL SIGNS:      04/22/2021 02:56 PM  BP 136/82 mmHg  Pulse 71 /min  Temperature 97.8 F / 36.5 C   MULTI-SYSTEM PHYSICAL EXAMINATION:    Constitutional: Well-nourished. No physical deformities. Normally developed. Good grooming.  Respiratory: No labored breathing, no use of accessory muscles. Clear to auscultation bilaterally  Cardiovascular: Normal temperature, normal extremity pulses, no swelling, no varicosities.  Skin: No paleness, no jaundice, no cyanosis. No lesion, no ulcer, no rash.  Neurologic / Psychiatric: Oriented to time, oriented to place, oriented to person. No depression, no anxiety, no agitation.  Gastrointestinal: No mass, no tenderness, no rigidity, non obese abdomen.  Eyes: Normal conjunctivae. Normal eyelids.  Musculoskeletal: Normal gait and station of head and neck.     Complexity of Data:  Source Of History:  Patient  Records Review:   Previous Doctor Records, Previous Patient Records  Urine Test Review:   Urinalysis   09/19/20 09/13/20 08/02/19  PSA  Total PSA 10.00 ng/mL 9.87 ng/ml 6.83 ng/dl  Free PSA 0.76 ng/mL    % Free PSA 8 % PSA      PROCEDURES:          Urinalysis w/Scope Dipstick Dipstick Cont'd Micro  Color: Yellow Bilirubin: Neg mg/dL WBC/hpf: NS (Not Seen)  Appearance: Cloudy Ketones: Neg mg/dL RBC/hpf: NS (Not Seen)  Specific Gravity: 1.015 Blood: Neg ery/uL Bacteria: NS (Not Seen)  pH: 7.5 Protein: Neg mg/dL Cystals: Amorph Phosphates  Glucose: Neg mg/dL Urobilinogen: 0.2 mg/dL Casts: NS (Not Seen)    Nitrites: Neg Trichomonas: Not Present    Leukocyte Esterase: Neg leu/uL Mucous: Not Present      Epithelial Cells: NS (Not Seen)      Yeast: NS (Not Seen)      Sperm: Not Present    ASSESSMENT:      ICD-10 Details  1 GU:   Prostate Cancer - C61 Chronic, Stable   PLAN:           Orders Labs Urine Culture          Document Letter(s):  Created for Patient:  Clinical Summary         Notes:   proceed with brachytherapy with spaceoar. Risks and benefits discussed. All questions answered.        Next Appointment:      Next Appointment: 04/30/2021 12:00 PM    Appointment Type: Surgery     Location: [facility] [phone]    Provider: Irine Seal, M.D.    Reason for Visit: OP Comfort

## 2021-04-30 NOTE — Op Note (Signed)
PATIENT:  [name]  PRE-OPERATIVE DIAGNOSIS:  Adenocarcinoma of the prostate  POST-OPERATIVE DIAGNOSIS:  Same  PROCEDURE:  Procedure(s): 1. I-125 radioactive seed implantation 2. SpaceOAR implantation. 3.  Cystoscopy  SURGEON:  Surgeon(s): Irine Seal MD  Radiation oncologist: Dr. Tyler Pita  ANESTHESIA:  General  EBL:  None  DRAINS: 52 French Foley catheter  INDICATION: Randy Rogers is a 70 y.o. with Stage T1c, Gleason 8(4+4) prostate cancer who has elected brachytherapy with EXRT and SpaceOAR for treatment.  Description of procedure: After informed consent the patient was brought to the major OR, placed on the table and administered general anesthesia. He was then moved to the modified lithotomy position with his perineum perpendicular to the floor. His perineum and genitalia were then sterilely prepped. An official timeout was then performed. A 16 French Foley catheter was then placed in the bladder and filled with dilute contrast, a rectal tube was placed in the rectum and the transrectal ultrasound probe was placed in the rectum and affixed to the stand. He was then sterilely draped.  The sterile grid was installed.   Anchor needles were then placed.   Real time ultrasonography was used along with the seed planning software spot-pro version 3.1-00. This was used to develop the seed plan including the number of needles as well as number of seeds required for complete and adequate coverage. Real-time ultrasonography was then used along with the previously developed plan  to implant a total of 61 seeds using 19 needles for a target dose of 145 Gy. This proceeded without difficulty or complication.  The anchor needles and guide were removed and the SpaceOAR needle was passed under US guidance into the fat stripe posterior to the prostate with the tip in the midline at mid prostate. A puff of NS confirmed appropriate positioning and the SpaceOAR view polymer was then injected over 10  seconds into the space with excellent distribution.     A Foley catheter was then removed as well as the transrectal ultrasound probe and rectal probe. Flexible cystoscopy was then performed using the 17 French flexible scope which revealed a normal urethra throughout its length down to the sphincter which appeared intact. The prostatic urethra was 2-3cm with bilobar hyperplasia. The bladder was then entered and fully and systematically inspected.  The ureteral orifices were noted to be of normal configuration and position. The mucosa revealed no evidence of tumors. There were also no stones identified within the bladder.  No seeds or spacers were seen and/or removed from the bladder.  The cystoscope was then removed.  The drapes were removed.  The perineum was cleaned and dressed.  He was taken out of the lithotomy position and was awakened and taken to recovery room in stable and satisfactory condition. He tolerated procedure well and there were no intraoperative complications.

## 2021-04-30 NOTE — Anesthesia Postprocedure Evaluation (Signed)
Anesthesia Post Note  Patient: [name]  Procedure(s) Performed: RADIOACTIVE SEED IMPLANT/BRACHYTHERAPY IMPLANT (Prostate) SPACE OAR INSTILLATION (Rectum) CYSTOSCOPY FLEXIBLE (Bladder)     Patient location during evaluation: PACU Anesthesia Type: General Level of consciousness: awake and alert Pain management: pain level controlled Vital Signs Assessment: post-procedure vital signs reviewed and stable Respiratory status: spontaneous breathing, nonlabored ventilation and respiratory function stable Cardiovascular status: blood pressure returned to baseline and stable Postop Assessment: no apparent nausea or vomiting Anesthetic complications: no   No notable events documented.  Last Vitals:  Vitals:   04/30/21 1335 04/30/21 1345  BP: 130/69 137/79  Pulse: (!) 46 (!) 56  Resp:  13  Temp: (!) 36.4 C   SpO2: 93% 96%    Last Pain:  Vitals:   04/30/21 1026  TempSrc: Oral  PainSc: 0-No pain                 Lidia Collum

## 2021-05-01 ENCOUNTER — Encounter (HOSPITAL_BASED_OUTPATIENT_CLINIC_OR_DEPARTMENT_OTHER): Payer: Self-pay | Admitting: Urology

## 2021-05-14 ENCOUNTER — Telehealth: Payer: Self-pay | Admitting: *Deleted

## 2021-05-14 DIAGNOSIS — C61 Malignant neoplasm of prostate: Secondary | ICD-10-CM | POA: Diagnosis not present

## 2021-05-14 NOTE — Telephone Encounter (Signed)
CALLED PATIENT TO REMIND OF SIM APPT. FOR 05-16-21 - ARRIVAL TIME- 10:15 AM @ [facility], SPOKE WITH PATIENT AND HE IS AWARE OF THIS APPT.

## 2021-05-16 ENCOUNTER — Ambulatory Visit: Payer: Medicare HMO | Admitting: Radiation Oncology

## 2021-05-16 DIAGNOSIS — C61 Malignant neoplasm of prostate: Secondary | ICD-10-CM

## 2021-05-17 ENCOUNTER — Ambulatory Visit
Admission: RE | Admit: 2021-05-17 | Discharge: 2021-05-17 | Disposition: A | Discharge status: Home / Self Care | Payer: Medicare HMO | Source: Ambulatory Visit | Attending: Radiation Oncology | Admitting: Radiation Oncology

## 2021-05-17 ENCOUNTER — Other Ambulatory Visit: Payer: Self-pay

## 2021-05-17 ENCOUNTER — Ambulatory Visit: Payer: Medicare HMO | Admitting: Radiation Oncology

## 2021-05-17 DIAGNOSIS — C61 Malignant neoplasm of prostate: Secondary | ICD-10-CM | POA: Insufficient documentation

## 2021-05-19 NOTE — Progress Notes (Signed)
  Radiation Oncology         [phone] ________________________________  Name: Randy Rogers MRN: [id number]  Date: 05/17/2021  DOB: 11/24/1950  COMPLEX SIMULATION NOTE  NARRATIVE:  The patient was brought to the [facility] today following prostate seed implantation approximately one month ago.  Identity was confirmed.  All relevant records and images related to the planned course of therapy were reviewed.  Then, the patient was set-up supine.  CT images were obtained.  The CT images were loaded into the planning software.  Then the prostate and rectum were contoured.  Treatment planning then occurred.  The implanted iodine 125 seeds were identified by the physics staff for projection of radiation distribution  I have requested : 3D Simulation  I have requested a DVH of the following structures: Prostate and rectum.    ________________________________  Sheral Apley Tammi Klippel, M.D.

## 2021-05-19 NOTE — Progress Notes (Signed)
  Radiation Oncology         [phone] ________________________________  Name: Randy Rogers MRN: [id number]  Date: 05/17/2021  DOB: 04/10/1951  SIMULATION AND TREATMENT PLANNING NOTE    ICD-10-CM   1. Malignant neoplasm of prostate ([place])  C61       DIAGNOSIS:  70 y.o. gentleman with Stage T1c adenocarcinoma of the prostate with Gleason score of 4+4, and PSA of 10.  NARRATIVE:  The patient was brought to the [facility].  Identity was confirmed.  All relevant records and images related to the planned course of therapy were reviewed.  The patient freely provided informed written consent to proceed with treatment after reviewing the details related to the planned course of therapy. The consent form was witnessed and verified by the simulation staff.  Then, the patient was set-up in a stable reproducible supine position for radiation therapy.  A vacuum lock pillow device was custom fabricated to position his legs in a reproducible immobilized position.  Then, I performed a urethrogram under sterile conditions to identify the prostatic apex.  CT images were obtained.  Surface markings were placed.  The CT images were loaded into the planning software.  Then the prostate target and avoidance structures including the rectum, bladder, bowel and hips were contoured.  Treatment planning then occurred.  The radiation prescription was entered and confirmed.  A total of one complex treatment devices were fabricated. I have requested : Intensity Modulated Radiotherapy (IMRT) is medically necessary for this case for the following reason:  Rectal sparing.Marland Kitchen  PLAN:  The patient will receive 45 Gy in 25 fractions of 1.8 Gy, to supplement an up-front prostate seed implant boost of 110 Gy to achieve a total nominal dose of 155 Gy.  ________________________________  Sheral Apley Tammi Klippel, M.D.

## 2021-05-22 DIAGNOSIS — C61 Malignant neoplasm of prostate: Secondary | ICD-10-CM | POA: Diagnosis not present

## 2021-05-23 ENCOUNTER — Ambulatory Visit: Payer: Medicare HMO | Admitting: Radiation Oncology

## 2021-05-28 ENCOUNTER — Ambulatory Visit
Admission: RE | Admit: 2021-05-28 | Discharge: 2021-05-28 | Disposition: A | Discharge status: Home / Self Care | Payer: Medicare HMO | Source: Ambulatory Visit | Attending: Radiation Oncology | Admitting: Radiation Oncology

## 2021-05-28 ENCOUNTER — Other Ambulatory Visit: Payer: Self-pay

## 2021-05-28 DIAGNOSIS — C61 Malignant neoplasm of prostate: Secondary | ICD-10-CM | POA: Diagnosis not present

## 2021-05-29 ENCOUNTER — Ambulatory Visit
Admission: RE | Admit: 2021-05-29 | Discharge: 2021-05-29 | Disposition: A | Discharge status: Home / Self Care | Payer: Medicare HMO | Source: Ambulatory Visit | Attending: Radiation Oncology | Admitting: Radiation Oncology

## 2021-05-29 ENCOUNTER — Encounter: Payer: Self-pay | Admitting: Family Medicine

## 2021-05-29 DIAGNOSIS — C61 Malignant neoplasm of prostate: Secondary | ICD-10-CM | POA: Diagnosis not present

## 2021-05-30 ENCOUNTER — Ambulatory Visit
Admission: RE | Admit: 2021-05-30 | Discharge: 2021-05-30 | Disposition: A | Discharge status: Home / Self Care | Payer: Medicare HMO | Source: Ambulatory Visit | Attending: Radiation Oncology | Admitting: Radiation Oncology

## 2021-05-30 ENCOUNTER — Other Ambulatory Visit: Payer: Self-pay

## 2021-05-30 DIAGNOSIS — C61 Malignant neoplasm of prostate: Secondary | ICD-10-CM | POA: Diagnosis not present

## 2021-05-31 ENCOUNTER — Ambulatory Visit
Admission: RE | Admit: 2021-05-31 | Discharge: 2021-05-31 | Disposition: A | Discharge status: Home / Self Care | Payer: Medicare HMO | Source: Ambulatory Visit | Attending: Radiation Oncology | Admitting: Radiation Oncology

## 2021-05-31 ENCOUNTER — Other Ambulatory Visit: Payer: Self-pay

## 2021-05-31 DIAGNOSIS — C61 Malignant neoplasm of prostate: Secondary | ICD-10-CM | POA: Diagnosis not present

## 2021-05-31 NOTE — Progress Notes (Signed)
Radiation Therapy and you booklet given to patient and reviewed. Questions answered and concerns addressed.

## 2021-06-03 ENCOUNTER — Ambulatory Visit: Payer: Medicare HMO

## 2021-06-03 ENCOUNTER — Telehealth: Payer: Self-pay

## 2021-06-03 DIAGNOSIS — Z1211 Encounter for screening for malignant neoplasm of colon: Secondary | ICD-10-CM

## 2021-06-03 NOTE — Telephone Encounter (Signed)
Pt called wanting a referral put in for GI so he can have a colonoscopy. Can pt have referral put in? Please Advise.

## 2021-06-03 NOTE — Telephone Encounter (Signed)
Referral placed.

## 2021-06-04 ENCOUNTER — Other Ambulatory Visit: Payer: Self-pay

## 2021-06-04 ENCOUNTER — Ambulatory Visit
Admission: RE | Admit: 2021-06-04 | Discharge: 2021-06-04 | Disposition: A | Discharge status: Home / Self Care | Payer: Medicare HMO | Source: Ambulatory Visit | Attending: Radiation Oncology | Admitting: Radiation Oncology

## 2021-06-04 DIAGNOSIS — C61 Malignant neoplasm of prostate: Secondary | ICD-10-CM | POA: Diagnosis not present

## 2021-06-04 DIAGNOSIS — Z510 Encounter for antineoplastic radiation therapy: Secondary | ICD-10-CM | POA: Diagnosis not present

## 2021-06-04 DIAGNOSIS — Z1211 Encounter for screening for malignant neoplasm of colon: Secondary | ICD-10-CM

## 2021-06-05 ENCOUNTER — Ambulatory Visit
Admission: RE | Admit: 2021-06-05 | Discharge: 2021-06-05 | Disposition: A | Discharge status: Home / Self Care | Payer: Medicare HMO | Source: Ambulatory Visit | Attending: Radiation Oncology | Admitting: Radiation Oncology

## 2021-06-05 DIAGNOSIS — C61 Malignant neoplasm of prostate: Secondary | ICD-10-CM | POA: Diagnosis not present

## 2021-06-05 DIAGNOSIS — Z510 Encounter for antineoplastic radiation therapy: Secondary | ICD-10-CM | POA: Diagnosis not present

## 2021-06-06 ENCOUNTER — Ambulatory Visit
Admission: RE | Admit: 2021-06-06 | Discharge: 2021-06-06 | Disposition: A | Discharge status: Home / Self Care | Payer: Medicare HMO | Source: Ambulatory Visit | Attending: Radiation Oncology | Admitting: Radiation Oncology

## 2021-06-06 ENCOUNTER — Other Ambulatory Visit: Payer: Self-pay

## 2021-06-06 DIAGNOSIS — Z510 Encounter for antineoplastic radiation therapy: Secondary | ICD-10-CM | POA: Diagnosis not present

## 2021-06-06 DIAGNOSIS — C61 Malignant neoplasm of prostate: Secondary | ICD-10-CM | POA: Diagnosis not present

## 2021-06-07 ENCOUNTER — Ambulatory Visit
Admission: RE | Admit: 2021-06-07 | Discharge: 2021-06-07 | Disposition: A | Discharge status: Home / Self Care | Payer: Medicare HMO | Source: Ambulatory Visit | Attending: Radiation Oncology | Admitting: Radiation Oncology

## 2021-06-07 DIAGNOSIS — Z510 Encounter for antineoplastic radiation therapy: Secondary | ICD-10-CM | POA: Diagnosis not present

## 2021-06-07 DIAGNOSIS — C61 Malignant neoplasm of prostate: Secondary | ICD-10-CM | POA: Diagnosis not present

## 2021-06-10 ENCOUNTER — Other Ambulatory Visit: Payer: Self-pay

## 2021-06-10 ENCOUNTER — Ambulatory Visit
Admission: RE | Admit: 2021-06-10 | Discharge: 2021-06-10 | Disposition: A | Discharge status: Home / Self Care | Payer: Medicare HMO | Source: Ambulatory Visit | Attending: Radiation Oncology | Admitting: Radiation Oncology

## 2021-06-10 ENCOUNTER — Encounter: Payer: Self-pay | Admitting: Radiation Oncology

## 2021-06-10 DIAGNOSIS — Z510 Encounter for antineoplastic radiation therapy: Secondary | ICD-10-CM | POA: Diagnosis not present

## 2021-06-10 DIAGNOSIS — C61 Malignant neoplasm of prostate: Secondary | ICD-10-CM | POA: Diagnosis not present

## 2021-06-11 ENCOUNTER — Ambulatory Visit
Admission: RE | Admit: 2021-06-11 | Discharge: 2021-06-11 | Disposition: A | Discharge status: Home / Self Care | Payer: Medicare HMO | Source: Ambulatory Visit | Attending: Radiation Oncology | Admitting: Radiation Oncology

## 2021-06-11 ENCOUNTER — Other Ambulatory Visit: Payer: Self-pay

## 2021-06-11 DIAGNOSIS — Z510 Encounter for antineoplastic radiation therapy: Secondary | ICD-10-CM | POA: Diagnosis not present

## 2021-06-11 DIAGNOSIS — C61 Malignant neoplasm of prostate: Secondary | ICD-10-CM | POA: Diagnosis not present

## 2021-06-11 NOTE — Progress Notes (Signed)
  Radiation Oncology         [phone] ________________________________  Name: Randy Rogers MRN: [id number]  Date: 06/10/2021  DOB: 27-Sep-1950  3D Planning Note   Prostate Brachytherapy Post-Implant Dosimetry  Diagnosis: 70 y.o. gentleman with Stage T1c adenocarcinoma of the prostate with Gleason score of 4+4, and PSA of 10.  Narrative: On a previous date, [name] returned following prostate seed implantation for post implant planning. He underwent CT scan complex simulation to delineate the three-dimensional structures of the pelvis and demonstrate the radiation distribution.  Since that time, the seed localization, and complex isodose planning with dose volume histograms have now been completed.  Results:   Prostate Coverage - The dose of radiation delivered to the 90% or more of the prostate gland (D90) was 101.65% of the prescription dose. This exceeds our goal of greater than 90%. Rectal Sparing - The volume of rectal tissue receiving the prescription dose or higher was 0.0 cc. This falls under our thresholds tolerance of 1.0 cc.  Impression: The prostate seed implant appears to show adequate target coverage and appropriate rectal sparing.  Plan:  The patient will continue to follow with urology for ongoing PSA determinations. I would anticipate a high likelihood for local tumor control with minimal risk for rectal morbidity.  ________________________________  Sheral Apley Tammi Klippel, M.D.

## 2021-06-12 ENCOUNTER — Ambulatory Visit
Admission: RE | Admit: 2021-06-12 | Discharge: 2021-06-12 | Disposition: A | Discharge status: Home / Self Care | Payer: Medicare HMO | Source: Ambulatory Visit | Attending: Radiation Oncology | Admitting: Radiation Oncology

## 2021-06-12 DIAGNOSIS — Z510 Encounter for antineoplastic radiation therapy: Secondary | ICD-10-CM | POA: Diagnosis not present

## 2021-06-12 DIAGNOSIS — C61 Malignant neoplasm of prostate: Secondary | ICD-10-CM | POA: Diagnosis not present

## 2021-06-13 ENCOUNTER — Other Ambulatory Visit: Payer: Self-pay

## 2021-06-13 ENCOUNTER — Ambulatory Visit
Admission: RE | Admit: 2021-06-13 | Discharge: 2021-06-13 | Disposition: A | Discharge status: Home / Self Care | Payer: Medicare HMO | Source: Ambulatory Visit | Attending: Radiation Oncology | Admitting: Radiation Oncology

## 2021-06-13 DIAGNOSIS — Z510 Encounter for antineoplastic radiation therapy: Secondary | ICD-10-CM | POA: Diagnosis not present

## 2021-06-13 DIAGNOSIS — C61 Malignant neoplasm of prostate: Secondary | ICD-10-CM | POA: Diagnosis not present

## 2021-06-14 ENCOUNTER — Encounter: Payer: Self-pay | Admitting: Family Medicine

## 2021-06-14 ENCOUNTER — Ambulatory Visit
Admission: RE | Admit: 2021-06-14 | Discharge: 2021-06-14 | Disposition: A | Discharge status: Home / Self Care | Payer: Medicare HMO | Source: Ambulatory Visit | Attending: Radiation Oncology | Admitting: Radiation Oncology

## 2021-06-14 DIAGNOSIS — C61 Malignant neoplasm of prostate: Secondary | ICD-10-CM | POA: Diagnosis not present

## 2021-06-14 DIAGNOSIS — Z510 Encounter for antineoplastic radiation therapy: Secondary | ICD-10-CM | POA: Diagnosis not present

## 2021-06-14 NOTE — Telephone Encounter (Signed)
Do you know who I send this to?

## 2021-06-17 ENCOUNTER — Other Ambulatory Visit: Payer: Self-pay

## 2021-06-17 ENCOUNTER — Ambulatory Visit
Admission: RE | Admit: 2021-06-17 | Discharge: 2021-06-17 | Disposition: A | Discharge status: Home / Self Care | Payer: Medicare HMO | Source: Ambulatory Visit | Attending: Radiation Oncology | Admitting: Radiation Oncology

## 2021-06-17 DIAGNOSIS — C61 Malignant neoplasm of prostate: Secondary | ICD-10-CM | POA: Diagnosis not present

## 2021-06-17 DIAGNOSIS — Z510 Encounter for antineoplastic radiation therapy: Secondary | ICD-10-CM | POA: Diagnosis not present

## 2021-06-17 NOTE — Telephone Encounter (Signed)
See below

## 2021-06-18 ENCOUNTER — Ambulatory Visit
Admission: RE | Admit: 2021-06-18 | Discharge: 2021-06-18 | Disposition: A | Discharge status: Home / Self Care | Payer: Medicare HMO | Source: Ambulatory Visit | Attending: Radiation Oncology | Admitting: Radiation Oncology

## 2021-06-18 ENCOUNTER — Other Ambulatory Visit: Payer: Self-pay

## 2021-06-18 DIAGNOSIS — Z510 Encounter for antineoplastic radiation therapy: Secondary | ICD-10-CM | POA: Diagnosis not present

## 2021-06-18 DIAGNOSIS — C61 Malignant neoplasm of prostate: Secondary | ICD-10-CM | POA: Diagnosis not present

## 2021-06-19 ENCOUNTER — Ambulatory Visit
Admission: RE | Admit: 2021-06-19 | Discharge: 2021-06-19 | Disposition: A | Discharge status: Home / Self Care | Payer: Medicare HMO | Source: Ambulatory Visit | Attending: Radiation Oncology | Admitting: Radiation Oncology

## 2021-06-19 ENCOUNTER — Other Ambulatory Visit: Payer: Self-pay

## 2021-06-19 DIAGNOSIS — C61 Malignant neoplasm of prostate: Secondary | ICD-10-CM | POA: Diagnosis not present

## 2021-06-19 DIAGNOSIS — Z510 Encounter for antineoplastic radiation therapy: Secondary | ICD-10-CM | POA: Diagnosis not present

## 2021-06-20 ENCOUNTER — Ambulatory Visit
Admission: RE | Admit: 2021-06-20 | Discharge: 2021-06-20 | Disposition: A | Discharge status: Home / Self Care | Payer: Medicare HMO | Source: Ambulatory Visit | Attending: Radiation Oncology | Admitting: Radiation Oncology

## 2021-06-20 DIAGNOSIS — Z510 Encounter for antineoplastic radiation therapy: Secondary | ICD-10-CM | POA: Diagnosis not present

## 2021-06-20 DIAGNOSIS — C61 Malignant neoplasm of prostate: Secondary | ICD-10-CM | POA: Diagnosis not present

## 2021-06-21 ENCOUNTER — Ambulatory Visit
Admission: RE | Admit: 2021-06-21 | Discharge: 2021-06-21 | Disposition: A | Discharge status: Home / Self Care | Payer: Medicare HMO | Source: Ambulatory Visit | Attending: Radiation Oncology | Admitting: Radiation Oncology

## 2021-06-21 ENCOUNTER — Other Ambulatory Visit: Payer: Self-pay

## 2021-06-21 DIAGNOSIS — C61 Malignant neoplasm of prostate: Secondary | ICD-10-CM | POA: Diagnosis not present

## 2021-06-21 DIAGNOSIS — Z510 Encounter for antineoplastic radiation therapy: Secondary | ICD-10-CM | POA: Diagnosis not present

## 2021-06-24 ENCOUNTER — Other Ambulatory Visit: Payer: Self-pay

## 2021-06-24 ENCOUNTER — Ambulatory Visit
Admission: RE | Admit: 2021-06-24 | Discharge: 2021-06-24 | Disposition: A | Discharge status: Home / Self Care | Payer: Medicare HMO | Source: Ambulatory Visit | Attending: Radiation Oncology | Admitting: Radiation Oncology

## 2021-06-24 DIAGNOSIS — Z510 Encounter for antineoplastic radiation therapy: Secondary | ICD-10-CM | POA: Diagnosis not present

## 2021-06-24 DIAGNOSIS — C61 Malignant neoplasm of prostate: Secondary | ICD-10-CM | POA: Diagnosis not present

## 2021-06-25 ENCOUNTER — Ambulatory Visit
Admission: RE | Admit: 2021-06-25 | Discharge: 2021-06-25 | Disposition: A | Discharge status: Home / Self Care | Payer: Medicare HMO | Source: Ambulatory Visit | Attending: Radiation Oncology | Admitting: Radiation Oncology

## 2021-06-25 DIAGNOSIS — C61 Malignant neoplasm of prostate: Secondary | ICD-10-CM | POA: Diagnosis not present

## 2021-06-25 DIAGNOSIS — Z510 Encounter for antineoplastic radiation therapy: Secondary | ICD-10-CM | POA: Diagnosis not present

## 2021-06-26 ENCOUNTER — Other Ambulatory Visit: Payer: Self-pay

## 2021-06-26 ENCOUNTER — Ambulatory Visit
Admission: RE | Admit: 2021-06-26 | Discharge: 2021-06-26 | Disposition: A | Discharge status: Home / Self Care | Payer: Medicare HMO | Source: Ambulatory Visit | Attending: Radiation Oncology | Admitting: Radiation Oncology

## 2021-06-26 DIAGNOSIS — Z510 Encounter for antineoplastic radiation therapy: Secondary | ICD-10-CM | POA: Diagnosis not present

## 2021-06-26 DIAGNOSIS — C61 Malignant neoplasm of prostate: Secondary | ICD-10-CM | POA: Diagnosis not present

## 2021-06-27 ENCOUNTER — Other Ambulatory Visit: Payer: Self-pay

## 2021-06-27 ENCOUNTER — Ambulatory Visit
Admission: RE | Admit: 2021-06-27 | Discharge: 2021-06-27 | Disposition: A | Discharge status: Home / Self Care | Payer: Medicare HMO | Source: Ambulatory Visit | Attending: Radiation Oncology | Admitting: Radiation Oncology

## 2021-06-27 DIAGNOSIS — Z510 Encounter for antineoplastic radiation therapy: Secondary | ICD-10-CM | POA: Diagnosis not present

## 2021-06-27 DIAGNOSIS — C61 Malignant neoplasm of prostate: Secondary | ICD-10-CM | POA: Diagnosis not present

## 2021-06-28 ENCOUNTER — Ambulatory Visit
Admission: RE | Admit: 2021-06-28 | Discharge: 2021-06-28 | Disposition: A | Discharge status: Home / Self Care | Payer: Medicare HMO | Source: Ambulatory Visit | Attending: Radiation Oncology | Admitting: Radiation Oncology

## 2021-06-28 ENCOUNTER — Other Ambulatory Visit: Payer: Self-pay

## 2021-06-28 DIAGNOSIS — Z510 Encounter for antineoplastic radiation therapy: Secondary | ICD-10-CM | POA: Diagnosis not present

## 2021-06-28 DIAGNOSIS — C61 Malignant neoplasm of prostate: Secondary | ICD-10-CM | POA: Diagnosis not present

## 2021-07-01 ENCOUNTER — Other Ambulatory Visit: Payer: Self-pay

## 2021-07-01 ENCOUNTER — Ambulatory Visit
Admission: RE | Admit: 2021-07-01 | Discharge: 2021-07-01 | Disposition: A | Discharge status: Home / Self Care | Payer: Medicare HMO | Source: Ambulatory Visit | Attending: Radiation Oncology | Admitting: Radiation Oncology

## 2021-07-01 DIAGNOSIS — C61 Malignant neoplasm of prostate: Secondary | ICD-10-CM | POA: Diagnosis not present

## 2021-07-01 DIAGNOSIS — Z510 Encounter for antineoplastic radiation therapy: Secondary | ICD-10-CM | POA: Diagnosis not present

## 2021-07-02 ENCOUNTER — Encounter: Payer: Self-pay | Admitting: Urology

## 2021-07-02 ENCOUNTER — Ambulatory Visit
Admission: RE | Admit: 2021-07-02 | Discharge: 2021-07-02 | Disposition: A | Discharge status: Home / Self Care | Payer: Medicare HMO | Source: Ambulatory Visit | Attending: Radiation Oncology | Admitting: Radiation Oncology

## 2021-07-02 DIAGNOSIS — Z510 Encounter for antineoplastic radiation therapy: Secondary | ICD-10-CM | POA: Diagnosis not present

## 2021-07-02 DIAGNOSIS — C61 Malignant neoplasm of prostate: Secondary | ICD-10-CM

## 2021-07-31 ENCOUNTER — Encounter: Payer: Self-pay | Admitting: Urology

## 2021-07-31 NOTE — Progress Notes (Signed)
  Radiation Oncology         [phone] ________________________________  Name: Randy Rogers MRN: [id number]  Date: 07/02/2021  DOB: [birth year]  End of Treatment Note  Diagnosis:   70 y.o. gentleman with Stage T1c adenocarcinoma of the prostate with Gleason score of 4+4, and PSA of 10.     Indication for treatment:  Curative, Definitive Radiotherapy       Radiation treatment dates:    1. 04/30/21: Brachytherapy boost 2. 05/28/21 - 07/02/21: IMRT prostate and pelvic nodes  Site/dose:  1. Insertion of radioactive I-125 seeds into the prostate gland; 110 Gy, boost therapy.  2. The prostate, seminal vesicles, and pelvic lymph nodes were treated to 45 Gy in 25 fractions of 1.8 Gy, to supplement an up-front prostate seed implant boost of 110 Gy to achieve a total nominal dose of 155 Gy.   Beams/energy:  1. A total of 19 needles were used to deposit 61 seeds in the prostate gland. The individual seed activity was 0.360 mCi.  2. The prostate, seminal vesicles, and pelvic lymph nodes were treated using VMAT intensity modulated radiotherapy delivering 6 megavolt photons. Image guidance was performed with CB-CT studies prior to each fraction. He was immobilized with a body fix lower extremity mold.  Narrative: The patient tolerated radiation treatment relatively well with only minor urinary irritation and modest fatigue.  He reported occasional dysuria in the mornings, hesitancy and weaker low of stream in the mornings, increased frequency, urgency and nocturia x3/night.   Plan: The patient has completed radiation treatment. He will return to radiation oncology clinic for routine followup in one month. I advised him to call or return sooner if he has any questions or concerns related to his recovery or treatment. ________________________________  Sheral Apley. Tammi Klippel, M.D.

## 2021-07-31 NOTE — Progress Notes (Signed)
Patient reports dysuria, polyuria, and nocturia x4. No other symptoms reported at this time. I-PSS Score of 10 (moderate). Meaningful use complete.  No current urinary management medications and urology follow-up scheduled for January 2023 -per patient.  Patient notified of 10:30-08/01/21 telephone appointment and verbalized understanding.  Patient preferred contact # [phone]

## 2021-08-01 ENCOUNTER — Ambulatory Visit
Admission: RE | Admit: 2021-08-01 | Discharge: 2021-08-01 | Disposition: A | Discharge status: Home / Self Care | Payer: Medicare HMO | Source: Ambulatory Visit | Attending: Urology | Admitting: Urology

## 2021-08-01 DIAGNOSIS — C61 Malignant neoplasm of prostate: Secondary | ICD-10-CM

## 2021-08-01 NOTE — Progress Notes (Signed)
Radiation Oncology         [phone] ________________________________  Name: Randy Rogers MRN: [id number]  Date: 08/01/2021  DOB: [birth year]  Post Treatment Note  CC: Marin Olp, MD  Franchot Gallo, MD  Diagnosis:   70 y.o. gentleman with Stage T1c adenocarcinoma of the prostate with Gleason score of 4+4, and PSA of 10.  Interval Since Last Radiation:  4 weeks  04/30/21: Insertion of radioactive I-125 seeds into the prostate gland; 110 Gy, boost therapy. 2. 05/28/21 - 07/02/21: The prostate, seminal vesicles, and pelvic lymph nodes were treated to 45 Gy in 25 fractions of 1.8 Gy, to supplement an up-front prostate seed implant boost of 110 Gy to achieve a total nominal dose of 155 Gy.  Narrative:  I spoke with the patient to conduct his routine scheduled 1 month follow up visit via telephone to spare the patient unnecessary potential exposure in the healthcare setting during the current COVID-19 pandemic.  The patient was notified in advance and gave permission to proceed with this visit format.  He tolerated radiation treatment relatively well with only minor urinary irritation and modest fatigue.  He reported occasional dysuria in the mornings, hesitancy and weaker low of stream in the mornings, increased frequency, urgency and nocturia x3/night.                               On review of systems, the patient states that he is doing well in general.  He continues with nocturia 3-4 times per night as well as daytime frequency and urgency but does feel like these are gradually improving.  He specifically denies dysuria, gross hematuria, straining to void, incomplete bladder emptying or incontinence.  He reports a healthy appetite and is maintaining his weight.  He denies abdominal pain, nausea, vomiting, diarrhea or constipation.  He is continued to tolerate the ADT fairly well aside from occasional hot flashes and decreased libido and stamina.  Overall, he is pleased with his progress  to date.  ALLERGIES:  has No Known Allergies.  Meds: Current Outpatient Medications  Medication Sig Dispense Refill   cetirizine (ZYRTEC) 10 MG tablet Take 10 mg by mouth at bedtime.     fexofenadine (ALLEGRA) 180 MG tablet Take 180 mg by mouth daily as needed.     fluticasone (FLONASE) 50 MCG/ACT nasal spray Place 2 sprays into both nostrils daily. (Patient taking differently: Place 2 sprays into both nostrils daily as needed.) 16 g 3   HYDROcodone-acetaminophen (NORCO) 5-325 MG tablet Take 1 tablet by mouth every 6 (six) hours as needed for moderate pain. 8 tablet 0   ibuprofen (ADVIL,MOTRIN) 200 MG tablet Take 400 mg by mouth every 6 (six) hours as needed.     sildenafil (VIAGRA) 100 MG tablet Take 100 mg by mouth daily as needed for erectile dysfunction.     No current facility-administered medications for this encounter.    Physical Findings:  vitals were not taken for this visit.  Pain Assessment Pain Score: 0-No pain/10 Unable to assess due to telephone follow-up visit format.  Lab Findings: Lab Results  Component Value Date   WBC 5.7 04/26/2021   HGB 15.4 04/26/2021   HCT 46.5 04/26/2021   MCV 94.9 04/26/2021   PLT 285 04/26/2021     Radiographic Findings: No results found.  Impression/Plan: 1. 70 y.o. gentleman with Stage T1c adenocarcinoma of the prostate with Gleason score of 4+4, and PSA of 10.  He will continue to follow up with urology for ongoing PSA determinations and has an appointment scheduled for labs on 08/07/2021 but does not currently have any follow-up scheduled with Dr. Diona Fanti to his knowledge.  He will be due for his next ADT injection in December 2022 so I advised that he should hear from one of the staff at [facility] urology in the next few days to get this scheduled.  He understands what to expect with regards to PSA monitoring going forward. I will look forward to following his response to treatment via correspondence with urology, and would be  happy to continue to participate in his care if clinically indicated. I talked to the patient about what to expect in the future, including his risk for erectile dysfunction and rectal bleeding. I encouraged him to call or return to the office if he has any questions regarding his previous radiation or possible radiation side effects. He was comfortable with this plan and will follow up as needed.     Nicholos Johns, PA-C

## 2021-08-05 ENCOUNTER — Ambulatory Visit: Payer: Medicare HMO

## 2021-08-07 DIAGNOSIS — C61 Malignant neoplasm of prostate: Secondary | ICD-10-CM | POA: Diagnosis not present

## 2021-08-13 ENCOUNTER — Other Ambulatory Visit: Payer: Self-pay

## 2021-08-13 ENCOUNTER — Encounter: Payer: Self-pay | Admitting: Family Medicine

## 2021-08-13 DIAGNOSIS — C61 Malignant neoplasm of prostate: Secondary | ICD-10-CM

## 2021-08-14 DIAGNOSIS — C61 Malignant neoplasm of prostate: Secondary | ICD-10-CM | POA: Diagnosis not present

## 2021-09-18 ENCOUNTER — Encounter: Payer: Medicare HMO | Admitting: Family Medicine

## 2021-09-18 ENCOUNTER — Telehealth: Payer: Self-pay | Admitting: Adult Health

## 2021-09-18 DIAGNOSIS — C61 Malignant neoplasm of prostate: Secondary | ICD-10-CM | POA: Diagnosis not present

## 2021-09-18 DIAGNOSIS — Z5111 Encounter for antineoplastic chemotherapy: Secondary | ICD-10-CM | POA: Diagnosis not present

## 2021-09-18 NOTE — Telephone Encounter (Signed)
Called patient to discuss survivorship care plan referral.  Patient would like to participate in this with a telephone visit.  I sent a scheduling message for him to be scheduled on February 21 at 1 PM with our survivorship navigator.  Patient verbalized understanding and appreciation of my call.  Wilber Bihari, NP 09/18/21 3:14 PM Medical Oncology and Hematology [facility] [place], [place] [address] Tel. [phone]    Fax. [phone]

## 2021-09-19 ENCOUNTER — Encounter: Payer: Self-pay | Admitting: Internal Medicine

## 2021-09-24 ENCOUNTER — Telehealth: Payer: Self-pay | Admitting: Family Medicine

## 2021-09-24 DIAGNOSIS — Z1283 Encounter for screening for malignant neoplasm of skin: Secondary | ICD-10-CM

## 2021-09-24 NOTE — Telephone Encounter (Signed)
Pt has an appointment with a dermatologist but needs to know if he needs a referral.

## 2021-09-25 ENCOUNTER — Other Ambulatory Visit: Payer: Self-pay

## 2021-09-25 NOTE — Telephone Encounter (Signed)
You may place a referral under screening for skin cancer to dermatology and list this practice in the notes

## 2021-09-30 DIAGNOSIS — L814 Other melanin hyperpigmentation: Secondary | ICD-10-CM | POA: Diagnosis not present

## 2021-09-30 DIAGNOSIS — L570 Actinic keratosis: Secondary | ICD-10-CM | POA: Diagnosis not present

## 2021-09-30 DIAGNOSIS — D1801 Hemangioma of skin and subcutaneous tissue: Secondary | ICD-10-CM | POA: Diagnosis not present

## 2021-09-30 DIAGNOSIS — L821 Other seborrheic keratosis: Secondary | ICD-10-CM | POA: Diagnosis not present

## 2021-09-30 NOTE — Telephone Encounter (Signed)
Referral placed in for dermatology.

## 2021-10-22 ENCOUNTER — Other Ambulatory Visit: Payer: Self-pay

## 2021-10-22 ENCOUNTER — Inpatient Hospital Stay: Payer: Medicare HMO | Attending: Adult Health | Admitting: *Deleted

## 2021-10-22 ENCOUNTER — Encounter: Payer: Self-pay | Admitting: *Deleted

## 2021-10-22 DIAGNOSIS — C61 Malignant neoplasm of prostate: Secondary | ICD-10-CM

## 2021-10-22 NOTE — Progress Notes (Signed)
2 Identifiers used for verification purposes only. No vital signs taken at appt as this was a telephone visit. SCP reviewed and completed. Pt denies pain today. Pt says his energy level is gradually improving.Pt does have irregular hot flashes due to the ADT receiving. Most of the urinary side effects from radiation has resolved. Bowel urgency has resolved as well.Last colonoscopy was 05/29/2016. Next due 05/2023. Pt will see his dermatologist  next week to revisit a prior area that was treated on his face.

## 2021-10-28 DIAGNOSIS — L570 Actinic keratosis: Secondary | ICD-10-CM | POA: Diagnosis not present

## 2021-11-06 DIAGNOSIS — C61 Malignant neoplasm of prostate: Secondary | ICD-10-CM | POA: Diagnosis not present

## 2021-11-13 DIAGNOSIS — C61 Malignant neoplasm of prostate: Secondary | ICD-10-CM | POA: Diagnosis not present

## 2022-02-14 ENCOUNTER — Ambulatory Visit (INDEPENDENT_AMBULATORY_CARE_PROVIDER_SITE_OTHER): Payer: Medicare HMO

## 2022-02-14 DIAGNOSIS — Z0000 Encounter for general adult medical examination without abnormal findings: Secondary | ICD-10-CM | POA: Diagnosis not present

## 2022-02-14 NOTE — Patient Instructions (Signed)
Randy Rogers , Thank you for taking time to come for your Medicare Wellness Visit. I appreciate your ongoing commitment to your health goals. Please review the following plan we discussed and let me know if I can assist you in the future.   Screening recommendations/referrals: Colonoscopy: Done 05/29/16 repeat every 7  years   Recommended yearly ophthalmology/optometry visit for glaucoma screening and checkup Recommended yearly dental visit for hygiene and checkup  Vaccinations: Influenza vaccine: Done 04/14/21 repeat every year  Pneumococcal vaccine: Up to date Tdap vaccine: Done 11/07/14 repeat every 10 years  Shingles vaccine: Completed    Covid-19: Completed 2/8, 3/4, 05/25/20 & 05/08/21  Advanced directives: Copies in chart   Conditions/risks identified: Maintain a healthy state   Next appointment: Follow up in one year for your annual wellness visit.   Preventive Care 40 Years and Older, Male Preventive care refers to lifestyle choices and visits with your health care provider that can promote health and wellness. What does preventive care include? A yearly physical exam. This is also called an annual well check. Dental exams once or twice a year. Routine eye exams. Ask your health care provider how often you should have your eyes checked. Personal lifestyle choices, including: Daily care of your teeth and gums. Regular physical activity. Eating a healthy diet. Avoiding tobacco and drug use. Limiting alcohol use. Practicing safe sex. Taking low doses of aspirin every day. Taking vitamin and mineral supplements as recommended by your health care provider. What happens during an annual well check? The services and screenings done by your health care provider during your annual well check will depend on your age, overall health, lifestyle risk factors, and family history of disease. Counseling  Your health care provider may ask you questions about your: Alcohol use. Tobacco  use. Drug use. Emotional well-being. Home and relationship well-being. Sexual activity. Eating habits. History of falls. Memory and ability to understand (cognition). Work and work Statistician. Screening  You may have the following tests or measurements: Height, weight, and BMI. Blood pressure. Lipid and cholesterol levels. These may be checked every 5 years, or more frequently if you are over 90 years old. Skin check. Lung cancer screening. You may have this screening every year starting at age 40 if you have a 30-pack-year history of smoking and currently smoke or have quit within the past 15 years. Fecal occult blood test (FOBT) of the stool. You may have this test every year starting at age 28. Flexible sigmoidoscopy or colonoscopy. You may have a sigmoidoscopy every 5 years or a colonoscopy every 10 years starting at age 63. Prostate cancer screening. Recommendations will vary depending on your family history and other risks. Hepatitis C blood test. Hepatitis B blood test. Sexually transmitted disease (STD) testing. Diabetes screening. This is done by checking your blood sugar (glucose) after you have not eaten for a while (fasting). You may have this done every 1-3 years. Abdominal aortic aneurysm (AAA) screening. You may need this if you are a current or former smoker. Osteoporosis. You may be screened starting at age 60 if you are at high risk. Talk with your health care provider about your test results, treatment options, and if necessary, the need for more tests. Vaccines  Your health care provider may recommend certain vaccines, such as: Influenza vaccine. This is recommended every year. Tetanus, diphtheria, and acellular pertussis (Tdap, Td) vaccine. You may need a Td booster every 10 years. Zoster vaccine. You may need this after age 19. Pneumococcal  13-valent conjugate (PCV13) vaccine. One dose is recommended after age 49. Pneumococcal polysaccharide (PPSV23) vaccine.  One dose is recommended after age 44. Talk to your health care provider about which screenings and vaccines you need and how often you need them. This information is not intended to replace advice given to you by your health care provider. Make sure you discuss any questions you have with your health care provider. Document Released: 09/14/2015 Document Revised: 05/07/2016 Document Reviewed: 06/19/2015 Elsevier Interactive Patient Education  2017 [place] Prevention in the Home Falls can cause injuries. They can happen to people of all ages. There are many things you can do to make your home safe and to help prevent falls. What can I do on the outside of my home? Regularly fix the edges of walkways and driveways and fix any cracks. Remove anything that might make you trip as you walk through a door, such as a raised step or threshold. Trim any bushes or trees on the path to your home. Use bright outdoor lighting. Clear any walking paths of anything that might make someone trip, such as rocks or tools. Regularly check to see if handrails are loose or broken. Make sure that both sides of any steps have handrails. Any raised decks and porches should have guardrails on the edges. Have any leaves, snow, or ice cleared regularly. Use sand or salt on walking paths during winter. Clean up any spills in your garage right away. This includes oil or grease spills. What can I do in the bathroom? Use night lights. Install grab bars by the toilet and in the tub and shower. Do not use towel bars as grab bars. Use non-skid mats or decals in the tub or shower. If you need to sit down in the shower, use a plastic, non-slip stool. Keep the floor dry. Clean up any water that spills on the floor as soon as it happens. Remove soap buildup in the tub or shower regularly. Attach bath mats securely with double-sided non-slip rug tape. Do not have throw rugs and other things on the floor that can make  you trip. What can I do in the bedroom? Use night lights. Make sure that you have a light by your bed that is easy to reach. Do not use any sheets or blankets that are too big for your bed. They should not hang down onto the floor. Have a firm chair that has side arms. You can use this for support while you get dressed. Do not have throw rugs and other things on the floor that can make you trip. What can I do in the kitchen? Clean up any spills right away. Avoid walking on wet floors. Keep items that you use a lot in easy-to-reach places. If you need to reach something above you, use a strong step stool that has a grab bar. Keep electrical cords out of the way. Do not use floor polish or wax that makes floors slippery. If you must use wax, use non-skid floor wax. Do not have throw rugs and other things on the floor that can make you trip. What can I do with my stairs? Do not leave any items on the stairs. Make sure that there are handrails on both sides of the stairs and use them. Fix handrails that are broken or loose. Make sure that handrails are as long as the stairways. Check any carpeting to make sure that it is firmly attached to the stairs. Fix any carpet  that is loose or worn. Avoid having throw rugs at the top or bottom of the stairs. If you do have throw rugs, attach them to the floor with carpet tape. Make sure that you have a light switch at the top of the stairs and the bottom of the stairs. If you do not have them, ask someone to add them for you. What else can I do to help prevent falls? Wear shoes that: Do not have high heels. Have rubber bottoms. Are comfortable and fit you well. Are closed at the toe. Do not wear sandals. If you use a stepladder: Make sure that it is fully opened. Do not climb a closed stepladder. Make sure that both sides of the stepladder are locked into place. Ask someone to hold it for you, if possible. Clearly mark and make sure that you can  see: Any grab bars or handrails. First and last steps. Where the edge of each step is. Use tools that help you move around (mobility aids) if they are needed. These include: Canes. Walkers. Scooters. Crutches. Turn on the lights when you go into a dark area. Replace any light bulbs as soon as they burn out. Set up your furniture so you have a clear path. Avoid moving your furniture around. If any of your floors are uneven, fix them. If there are any pets around you, be aware of where they are. Review your medicines with your doctor. Some medicines can make you feel dizzy. This can increase your chance of falling. Ask your doctor what other things that you can do to help prevent falls. This information is not intended to replace advice given to you by your health care provider. Make sure you discuss any questions you have with your health care provider. Document Released: 06/14/2009 Document Revised: 01/24/2016 Document Reviewed: 09/22/2014 Elsevier Interactive Patient Education  2017 [employer].

## 2022-02-14 NOTE — Progress Notes (Signed)
Virtual Visit via Telephone Note  I connected with  Randy Rogers on 02/14/22 at  2:15 PM EDT by telephone and verified that I am speaking with the correct person using two identifiers.  Medicare Annual Wellness visit completed telephonically due to Covid-19 pandemic.   Persons participating in this call: This Health Coach and this patient.   Location: Patient: home  Provider: office    I discussed the limitations, risks, security and privacy concerns of performing an evaluation and management service by telephone and the availability of in person appointments. The patient expressed understanding and agreed to proceed.  Unable to perform video visit due to video visit attempted and failed and/or patient does not have video capability.   Some vital signs may be absent or patient reported.   Willette Brace, LPN   Subjective:   Randy Rogers is a 71 y.o. male who presents for Medicare Annual/Subsequent preventive examination.  Review of Systems     Cardiac Risk Factors include: advanced age (>40mn, >>64women);male gender;dyslipidemia     Objective:    There were no vitals filed for this visit. There is no height or weight on file to calculate BMI.     02/14/2022    2:15 PM 07/31/2021   11:15 AM 04/30/2021   10:24 AM 04/04/2021    8:54 AM 01/01/2021    2:45 PM 07/16/2020   11:00 AM 07/04/2019    9:00 AM  Advanced Directives  Does Patient Have a Medical Advance Directive? Yes Yes Yes Yes No Yes Yes  Type of AAcademic librarianLiving will;Healthcare Power of AForest ParkLiving will Living will  Living will [place]ount Pleasant Mills[place] will  Does patient want to make changes to medical advance directive?    No - Patient declined   No - Patient declined  Copy of HShirleyin Chart? Yes - validated most recent copy scanned in chart (See row information)  No - copy requested    No - copy requested  Would  patient like information on creating a medical advance directive?     No - Patient declined      Current Medications (verified) Outpatient Encounter Medications as of 02/14/2022  Medication Sig   cetirizine (ZYRTEC) 10 MG tablet Take 10 mg by mouth at bedtime.   cholecalciferol (VITAMIN D3) 25 MCG (1000 UNIT) tablet Take 1,000 Units by mouth daily.   fexofenadine (ALLEGRA) 180 MG tablet Take 180 mg by mouth daily as needed.   fluticasone (FLONASE) 50 MCG/ACT nasal spray Place 2 sprays into both nostrils daily. (Patient taking differently: Place 2 sprays into both nostrils daily as needed.)   ibuprofen (ADVIL,MOTRIN) 200 MG tablet Take 400 mg by mouth every 6 (six) hours as needed.   sildenafil (VIAGRA) 100 MG tablet Take 100 mg by mouth daily as needed for erectile dysfunction.   zinc gluconate 50 MG tablet Take 50 mg by mouth daily.   PFIZER COVID-19 VAC BIVALENT injection    [DISCONTINUED] HYDROcodone-acetaminophen (NORCO) 5-325 MG tablet Take 1 tablet by mouth every 6 (six) hours as needed for moderate pain. (Patient not taking: Reported on 10/22/2021)   No facility-administered encounter medications on file as of 02/14/2022.    Allergies (verified) Patient has no known allergies.   History: Past Medical History:  Diagnosis Date   Allergic rhinitis, seasonal    Arthritis    ED (erectile dysfunction)    History of adenomatous polyp of colon  History of kidney stones    History of prostatitis 08/2019   History of syncope    per pt episode 2018 (echo in epic 04-27-2017 ef 55-60%, mild LAE) , told vasovagal ,  stated has had previous episode yrs ago had normal work-up,  pt states happens when is stress and needs to eat/ drink   PAC (premature atrial contraction)    Prostate cancer [facility])    urologist-- dr Jeffie Pollock-- dx 03/ 2022, Gleason 4+4, PSA 10   Wears glasses    Past Surgical History:  Procedure Laterality Date   COLONOSCOPY     last one 04-27-2017 by gessner   CYSTOSCOPY   04/30/2021   Procedure: CYSTOSCOPY FLEXIBLE;  Surgeon: Irine Seal, MD;  Location: [place];  Service: Urology;;  NO SEEDS FOUND IN BLADDER   KNEE ARTHROSCOPY Right    late 1970s   PROSTATE BIOPSY     RADIOACTIVE SEED IMPLANT N/A 04/30/2021   Procedure: RADIOACTIVE SEED IMPLANT/BRACHYTHERAPY IMPLANT;  Surgeon: Irine Seal, MD;  Location: [facility];  Service: Urology;  Laterality: N/A;  61 seeds implanted   SPACE OAR INSTILLATION N/A 04/30/2021   Procedure: SPACE OAR INSTILLATION;  Surgeon: Irine Seal, MD;  Location: [facility];  Service: Urology;  Laterality: N/A;   Family History  Problem Relation Age of Onset   Tuberculosis Mother    Emphysema Maternal Grandfather        smoker   Breast cancer Neg Hx    Prostate cancer Neg Hx    Colon cancer Neg Hx    Pancreatic cancer Neg Hx    Social History   Socioeconomic History   Marital status: Soil scientist    Spouse name: Not on file   Number of children: 3   Years of education: Not on file   Highest education level: Not on file  Occupational History   Occupation: Retired   Tobacco Use   Smoking status: Never   Smokeless tobacco: Never  Vaping Use   Vaping Use: Never used  Substance and Sexual Activity   Alcohol use: Yes    Alcohol/week: 14.0 standard drinks of alcohol    Types: 14 Standard drinks or equivalent per week   Drug use: Never   Sexual activity: Yes  Other Topics Concern   Not on file  Social History Narrative   Divorced twice. Oldest daughter married to pharmD, homemaker( 1 grandson). Middle child (1 granddaughter)- lecturer/pharmD [employer] Youngest daughter premed as of 2020.       Adopted by grandparents.      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      Hobbies: music, arts         Social Determinants of Health   Financial Resource Strain: Low Risk  (02/14/2022)   Overall Financial Resource Strain  (CARDIA)    Difficulty of Paying Living Expenses: Not hard at all  Food Insecurity: No Food Insecurity (02/14/2022)   Hunger Vital Sign    Worried About Running Out of Food in the Last Year: Never true    [place] in the Last Year: Never true  Transportation Needs: No Transportation Needs (02/14/2022)   PRAPARE - Hydrologist (Medical): No    Lack of Transportation (Non-Medical): No  Physical Activity: Sufficiently Active (02/14/2022)   Exercise Vital Sign    Days of Exercise per Week: 7 days    Minutes of Exercise per Session:  90 min  Stress: No Stress Concern Present (02/14/2022)   Pablo Pena    Feeling of Stress : Not at all  Social Connections: Moderately Integrated (02/14/2022)   Social Connection and Isolation Panel [NHANES]    Frequency of Communication with Friends and Family: More than three times a week    Frequency of Social Gatherings with Friends and Family: More than three times a week    Attends Religious Services: Never    Marine scientist or Organizations: Yes    Attends Music therapist: 1 to 4 times per year    Marital Status: Living with partner    Tobacco Counseling Counseling given: Not Answered   Clinical Intake:  Pre-visit preparation completed: Yes  Pain : No/denies pain     BMI - recorded: 26.7 Nutritional Status: BMI 25 -29 Overweight Nutritional Risks: None Diabetes: No  How often do you need to have someone help you when you read instructions, pamphlets, or other written materials from your doctor or pharmacy?: 1 - Never  Diabetic?no  Interpreter Needed?: No  Information entered by :: Charlott Rakes, LPN   Activities of Daily Living    02/14/2022    2:16 PM 04/30/2021   10:27 AM  In your present state of health, do you have any difficulty performing the following activities:  Hearing? 0 0  Vision? 0 0  Difficulty  concentrating or making decisions? 0 0  Walking or climbing stairs? 0 0  Dressing or bathing? 0 0  Doing errands, shopping? 0   Preparing Food and eating ? N   Using the Toilet? N   In the past six months, have you accidently leaked urine? N   Do you have problems with loss of bowel control? N   Managing your Medications? N   Managing your Finances? N   Housekeeping or managing your Housekeeping? N     Patient Care Team: Marin Olp, MD as PCP - General (Family Medicine) Irine Seal, MD as Attending Physician (Urology) Tyler Pita, MD as Consulting Physician (Radiation Oncology) Delice Bison Charlestine Massed, NP as Nurse Practitioner (Hematology and Oncology) Harmon Pier, RN as Registered Nurse  Indicate any recent Medical Services you may have received from other than [facility] providers in the past year (date may be approximate).     Assessment:   This is a routine wellness examination for Dhiren.  Hearing/Vision screen Hearing Screening - Comments:: Pt denies any hearing issues  Vision Screening - Comments:: Pt [facility]   Dietary issues and exercise activities discussed: Current Exercise Habits: Home exercise routine, Type of exercise: walking, Time (Minutes): > 60, Frequency (Times/Week): 7, Weekly Exercise (Minutes/Week): 0   Goals Addressed             This Visit's Progress    Patient Stated       Stay healthy        Depression Screen    02/14/2022    2:14 PM 09/13/2020   10:33 AM 07/16/2020   10:58 AM 07/04/2019    9:05 AM 07/04/2019    9:00 AM 06/02/2018    2:53 PM 06/02/2018    2:20 PM  PHQ 2/9 Scores  PHQ - 2 Score 0 0 0 0 0 0 0  PHQ- 9 Score    0  0     Fall Risk    02/14/2022    2:16 PM 07/16/2020   11:01  AM 07/04/2019    9:00 AM 06/02/2018    2:20 PM 04/16/2017    8:03 AM  Fall Risk   Falls in the past year? 0 0 0 No Yes  Number falls in past yr: 0 0   1  Injury with Fall? 0 0 0  Yes  Risk for fall due to :  Impaired vision Impaired vision     Follow up Falls prevention discussed Falls prevention discussed Falls evaluation completed;Education provided;Falls prevention discussed      FALL RISK PREVENTION PERTAINING TO THE HOME:  Any stairs in or around the home? Yes  If so, are there any without handrails? No  Home free of loose throw rugs in walkways, pet beds, electrical cords, etc? Yes  Adequate lighting in your home to reduce risk of falls? Yes   ASSISTIVE DEVICES UTILIZED TO PREVENT FALLS:  Life alert? No  Use of a cane, walker or w/c? No  Grab bars in the bathroom? Yes  Shower chair or bench in shower? Yes  Elevated toilet seat or a handicapped toilet? No   TIMED UP AND GO:  Was the test performed? No .  Cognitive Function:        02/14/2022    2:18 PM 07/16/2020   11:03 AM 07/04/2019    9:01 AM 06/02/2018    2:21 PM  6CIT Screen  What Year? 0 points 0 points 0 points 0 points  What month? 0 points 0 points 0 points 0 points  What time? 0 points  0 points 0 points  Count back from 20 0 points 0 points 0 points 0 points  Months in reverse 0 points 0 points 0 points 0 points  Repeat phrase 0 points 0 points 0 points 0 points  Total Score 0 points  0 points 0 points    Immunizations Immunization History  Administered Date(s) Administered   Fluad Quad(high Dose 65+) 05/11/2019, 04/14/2021   Influenza, High Dose Seasonal PF 05/05/2018   Influenza,inj,Quad PF,6+ Mos 05/01/2015, 05/22/2016   Influenza-Unspecified 06/01/2014, 05/14/2017, 04/27/2020, 04/14/2021   Moderna Covid-19 Vaccine Bivalent Booster 52yr & up 05/08/2021   PFIZER(Purple Top)SARS-COV-2 Vaccination 10/10/2019, 11/03/2019, 05/25/2020   Pneumococcal Conjugate-13 12/16/2016   Pneumococcal Polysaccharide-23 12/30/2017   Td 02/26/2007   Tdap 11/07/2014   Zoster Recombinat (Shingrix) 03/10/2018, 06/24/2018   Zoster, Live 02/22/2013    TDAP status: Up to date  Flu Vaccine status: Up to  date  Pneumococcal vaccine status: Up to date  Covid-19 vaccine status: Completed vaccines  Qualifies for Shingles Vaccine? Yes   Zostavax completed Yes   Shingrix Completed?: Yes  Screening Tests Health Maintenance  Topic Date Due   Hepatitis C Screening  09/21/2109 (Originally 12/14/1968)   INFLUENZA VACCINE  04/01/2022   COLONOSCOPY (Pts 45-452yrInsurance coverage will need to be confirmed)  05/30/2023   TETANUS/TDAP  11/06/2024   Pneumonia Vaccine 6527Years old  Completed   COVID-19 Vaccine  Completed   Zoster Vaccines- Shingrix  Completed   HPV VACCINES  Aged Out    Health Maintenance  There are no preventive care reminders to display for this patient.  Colorectal cancer screening: Type of screening: Colonoscopy. Completed 05/29/16. Repeat every 7 years   Additional Screening:  Hepatitis C Screening: does qualify;  Vision Screening: Recommended annual ophthalmology exams for early detection of glaucoma and other disorders of the eye. Is the patient up to date with their annual eye exam?  No  Who is the provider or what is the  name of the office in which the patient attends annual eye exams? [facility]  If pt is not established with a provider, would they like to be referred to a provider to establish care? No .   Dental Screening: Recommended annual dental exams for proper oral hygiene  Community Resource Referral / Chronic Care Management: CRR required this visit?  No   CCM required this visit?  No      Plan:     I have personally reviewed and noted the following in the patient's chart:   Medical and social history Use of alcohol, tobacco or illicit drugs  Current medications and supplements including opioid prescriptions. Patient is not currently taking opioid prescriptions. Functional ability and status Nutritional status Physical activity Advanced directives List of other physicians Hospitalizations, surgeries, and ER visits in previous  12 months Vitals Screenings to include cognitive, depression, and falls Referrals and appointments  In addition, I have reviewed and discussed with patient certain preventive protocols, quality metrics, and best practice recommendations. A written personalized care plan for preventive services as well as general preventive health recommendations were provided to patient.     Willette Brace, LPN   6/41/5830   Nurse Notes: None

## 2022-04-07 DIAGNOSIS — C61 Malignant neoplasm of prostate: Secondary | ICD-10-CM | POA: Diagnosis not present

## 2022-04-16 DIAGNOSIS — C61 Malignant neoplasm of prostate: Secondary | ICD-10-CM | POA: Diagnosis not present

## 2022-05-07 ENCOUNTER — Encounter: Payer: Medicare HMO | Admitting: Family Medicine

## 2022-05-16 ENCOUNTER — Telehealth: Payer: Self-pay | Admitting: *Deleted

## 2022-05-16 NOTE — Patient Outreach (Signed)
  Care Coordination   05/16/2022 Name: Randy Rogers MRN: [id number] DOB: 1951/03/24   Care Coordination Outreach Attempts:  An unsuccessful telephone outreach was attempted today to offer the patient information about available care coordination services as a benefit of their health plan.   Follow Up Plan:  Additional outreach attempts will be made to offer the patient care coordination information and services.   Encounter Outcome:  No Answer  Care Coordination Interventions Activated:  No   Care Coordination Interventions:  No, not indicated    Raina Mina, RN Care Management Coordinator [place] Office [phone]

## 2022-05-26 ENCOUNTER — Encounter: Payer: Self-pay | Admitting: *Deleted

## 2022-05-30 ENCOUNTER — Telehealth: Payer: Self-pay

## 2022-05-30 NOTE — Patient Outreach (Signed)
  Care Coordination   05/30/2022 Name: Randy Rogers MRN: [id number] DOB: Jan 19, 1951   Care Coordination Outreach Attempts:  A second unsuccessful outreach was attempted today to offer the patient with information about available care coordination services as a benefit of their health plan.     Follow Up Plan:  Additional outreach attempts will be made to offer the patient care coordination information and services.   Encounter Outcome:  No Answer  Care Coordination Interventions Activated:  No   Care Coordination Interventions:  No, not indicated    Peter Garter RN, BSN,CCM, [facility] [phone]

## 2022-06-13 ENCOUNTER — Telehealth: Payer: Self-pay

## 2022-06-13 NOTE — Patient Outreach (Signed)
  Care Coordination   06/13/2022 Name: Randy Rogers MRN: [id number] DOB: 02-05-1951   Care Coordination Outreach Attempts:  A third unsuccessful outreach was attempted today to offer the patient with information about available care coordination services as a benefit of their health plan.   Follow Up Plan:  No further outreach attempts will be made at this time. We have been unable to contact the patient to offer or enroll patient in care coordination services  Encounter Outcome:  No Answer  Care Coordination Interventions Activated:  No   Care Coordination Interventions:  No, not indicated    Peter Garter RN, BSN,CCM, [facility] [phone]

## 2022-07-01 ENCOUNTER — Encounter: Payer: Self-pay | Admitting: Family Medicine

## 2022-07-01 ENCOUNTER — Ambulatory Visit (INDEPENDENT_AMBULATORY_CARE_PROVIDER_SITE_OTHER): Payer: Medicare HMO | Admitting: Family Medicine

## 2022-07-01 VITALS — BP 128/69 | HR 77 | Temp 97.3°F | Ht 70.0 in | Wt 197.2 lb

## 2022-07-01 DIAGNOSIS — C61 Malignant neoplasm of prostate: Secondary | ICD-10-CM

## 2022-07-01 DIAGNOSIS — E785 Hyperlipidemia, unspecified: Secondary | ICD-10-CM | POA: Diagnosis not present

## 2022-07-01 DIAGNOSIS — Z0000 Encounter for general adult medical examination without abnormal findings: Secondary | ICD-10-CM

## 2022-07-01 NOTE — Patient Instructions (Addendum)
Schedule a lab visit at the check out desk within 2 weeks. Return for future fasting labs meaning nothing but water after midnight please. Ok to take your medications with water.   asked him to do some home monitoring and update in a week or so on blood pressure  Recommended follow up: Return in about 1 year (around 07/02/2023) for physical or sooner if needed.Schedule b4 you leave.

## 2022-07-01 NOTE — Progress Notes (Signed)
Phone: [phone]   Subjective:  Patient presents today for their annual physical. Chief complaint-noted.   See problem oriented charting- ROS- full  review of systems was completed and negative  except for: hot flashes, some msk issues- see below  The following were reviewed and entered/updated in epic: Past Medical History:  Diagnosis Date   Allergic rhinitis, seasonal    Arthritis    ED (erectile dysfunction)    History of adenomatous polyp of colon    History of kidney stones    History of prostatitis 08/2019   History of syncope    per pt episode 2018 (echo in epic 04-27-2017 ef 55-60%, mild LAE) , told vasovagal ,  stated has had previous episode yrs ago had normal work-up,  pt states happens when is stress and needs to eat/ drink   PAC (premature atrial contraction)    Prostate cancer [facility])    urologist-- dr Jeffie Pollock-- dx 03/ 2022, Gleason 4+4, PSA 10   Wears glasses    Patient Active Problem List   Diagnosis Date Noted   Malignant neoplasm of prostate ([place]) 01/01/2021    Priority: High   Hyperlipidemia 06/14/2014    Priority: Medium    PAC (premature atrial contraction) 07/04/2019    Priority: Low   Actinic keratosis 01/07/2010    Priority: Low   ALLERGIC RHINITIS, SEASONAL 06/03/2007    Priority: Low   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    Priority: Low   History of colonic polyps 03/04/2007    Priority: Low   Syncope 04/16/2017   Past Surgical History:  Procedure Laterality Date   COLONOSCOPY     last one 04-27-2017 by gessner   CYSTOSCOPY  04/30/2021   Procedure: CYSTOSCOPY FLEXIBLE;  Surgeon: Irine Seal, MD;  Location: [facility];  Service: Urology;;  NO SEEDS FOUND IN BLADDER   KNEE ARTHROSCOPY Right    late 1970s   PROSTATE BIOPSY     RADIOACTIVE SEED IMPLANT N/A 04/30/2021   Procedure: RADIOACTIVE SEED IMPLANT/BRACHYTHERAPY IMPLANT;  Surgeon: Irine Seal, MD;  Location: [facility];  Service: Urology;  Laterality: N/A;  61 seeds  implanted   SPACE OAR INSTILLATION N/A 04/30/2021   Procedure: SPACE OAR INSTILLATION;  Surgeon: Irine Seal, MD;  Location: [facility];  Service: Urology;  Laterality: N/A;    Family History  Problem Relation Age of Onset   Tuberculosis Mother    Emphysema Maternal Grandfather        smoker   Breast cancer Neg Hx    Prostate cancer Neg Hx    Colon cancer Neg Hx    Pancreatic cancer Neg Hx     Medications- reviewed and updated Current Outpatient Medications  Medication Sig Dispense Refill   cetirizine (ZYRTEC) 10 MG tablet Take 10 mg by mouth at bedtime.     cholecalciferol (VITAMIN D3) 25 MCG (1000 UNIT) tablet Take 1,000 Units by mouth daily.     fexofenadine (ALLEGRA) 180 MG tablet Take 180 mg by mouth daily as needed.     fluticasone (FLONASE) 50 MCG/ACT nasal spray Place 2 sprays into both nostrils daily. (Patient taking differently: Place 2 sprays into both nostrils daily as needed.) 16 g 3   ibuprofen (ADVIL,MOTRIN) 200 MG tablet Take 400 mg by mouth every 6 (six) hours as needed.     sildenafil (VIAGRA) 100 MG tablet Take 100 mg by mouth daily as needed for erectile dysfunction.     zinc gluconate 50 MG tablet Take 50 mg  by mouth daily.     FLUZONE HIGH-DOSE QUADRIVALENT 0.7 ML SUSY      No current facility-administered medications for this visit.    Allergies-reviewed and updated No Known Allergies  Social History   Social History Narrative   Long term partner - living together since 2019. Divorced twice. 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 Youngest daughter Phillips Odor planned January 2024- update 2023      Adopted by grandparents.      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 Oropharynx normal Neck: no thyromegaly CV: RRR no murmurs rubs or gallops Lungs: CTAB no crackles, wheeze, rhonchi Abdomen: soft/nontender/nondistended/normal bowel sounds. No rebound or guarding.  Ext: no edema Skin: warm, dry Neuro: grossly normal, moves all extremities, PERRLA    Assessment and Plan  71 y.o. male presenting for annual physical.  Health Maintenance counseling: 1. Anticipatory guidance: Patient counseled regarding regular dental exams -q6 months, eye exams- usually every 2 years- no changes,  avoiding smoking and second hand smoke , limiting alcohol to 2 beverages per day - has slipped a little and sometimes above 2 per day- encouraged limiting, no illicit drugs .   2. Risk factor reduction:  Advised patient of need for regular exercise and diet rich and fruits and vegetables to reduce risk of heart attack and stroke.  Exercise- 4-9 miles most days on step counter.  Diet/weight management-weight up 14 lbs from last year- states was deliberate response to potential side effects from androgen deprivation- he states prefers to stay around this weight or mild weight loss. Still eating healthy foods in general- cutting down on alcohol would help Wt Readings from Last 3 Encounters:  07/01/22 197 lb 3.2 oz (89.4 kg)  04/30/21 189 lb (85.7 kg)  04/26/21 188 lb (85.3 kg)  3. Immunizations/screenings/ancillary studies- offered prevnar 20- opts out , discussed new covid shot - he is debating as of now , flu shot- already had  Immunization History  Administered Date(s) Administered   Fluad Quad(high Dose 65+) 05/11/2019, 04/14/2021   Influenza, High Dose Seasonal PF 05/05/2018, 04/03/2022   Influenza,inj,Quad PF,6+ Mos 05/01/2015, 05/22/2016   Influenza-Unspecified 06/01/2014, 05/14/2017, 04/27/2020, 04/14/2021   Moderna Covid-19 Vaccine Bivalent Booster 23yr & up  05/08/2021   PFIZER(Purple Top)SARS-COV-2 Vaccination 10/10/2019, 11/03/2019, 05/25/2020   Pfizer Covid-19 Vaccine Bivalent Booster 164yr& up 01/21/2022   Pneumococcal Conjugate-13 12/16/2016   Pneumococcal Polysaccharide-23 12/30/2017   Td 02/26/2007   Tdap 11/07/2014   Zoster Recombinat (Shingrix) 03/10/2018, 06/24/2018   Zoster, Live 02/22/2013   4. Prostate cancer - see below  Lab Results  Component Value Date   PSA 10.00 09/19/2020   PSA 9.87 (H) 09/13/2020   PSA 6.12 (H) 09/23/2019   5. Colon cancer screening - 05/29/16 with 5 year follow up originally but with new guidelines pushed to 7 years- planned next year 6. Skin cancer screening-Dr. McAnabel [facility] dermat[facility] in February- had sounds like AK frozen but came back- he will schedule follow up. advised regular sunscreen use.  7. Smoking  associated screening (lung cancer screening, AAA screen 65-75, UA)- never smoker 8. STD screening - long term partner- no concern for std  Status of chronic or acute concerns   #social update- [place] more time in [place]- enjoyed this  #prostate cancer-  follows with Dr. Diona Fanti- history of radiation and long term androgen deprivation at present (but being weaned off at present)- last saw urology in august- reports good report -does get hot flashes. Hand pain form potential OA more common, also more plantar fascia issues. Able to manage urination side. Sugars up some but he states a year ago was 96  #elevated BP reading S: medication: none Home readings #s: 135/70s at home when last checked- on treatments had been as low as 120s BP Readings from Last 3 Encounters:  07/01/22 (!) 140/80  04/30/21 (!) 149/78  04/26/21 (!) 145/84  A/P: BP up slightly here but has reported good readings at home- asked him to do some home monitoring and update in a week or so- could bring home cuff to future visit  #hyperlipidemia S: Medication: not interested in statin The 10-year ASCVD risk score (Arnett DK,  et al., 2019) is: 18.2% Lab Results  Component Value Date   CHOL 172 09/13/2020   HDL 57.90 09/13/2020   LDLCALC 102 (H) 09/13/2020   LDLDIRECT 141.4 01/10/2010   TRIG 57.0 09/13/2020   CHOLHDL 3 09/13/2020   A/P: update lipids- he is not super interested in ct calcium scoring- states would not likely change diet more heavily  #ED- viagra as needed  Recommended follow up: Return in about 1 year (around 07/02/2023) for physical or sooner if needed.Schedule b4 you leave. Future Appointments  Date Time Provider Amarii Amy  02/27/2023  2:00 PM [facility]   Lab/Order associations: NOT fasting   ICD-10-CM   1. Preventative health care  Z00.00     2. Malignant neoplasm of prostate ([place]) Chronic C61     3. Hyperlipidemia, unspecified hyperlipidemia type  E78.5 CBC with Differential/Platelet    Comprehensive metabolic panel    Lipid panel      No orders of the defined types were placed in this encounter.   Return precautions advised.  Garret Reddish, MD

## 2022-07-03 ENCOUNTER — Other Ambulatory Visit (INDEPENDENT_AMBULATORY_CARE_PROVIDER_SITE_OTHER): Payer: Medicare HMO

## 2022-07-03 DIAGNOSIS — E785 Hyperlipidemia, unspecified: Secondary | ICD-10-CM | POA: Diagnosis not present

## 2022-07-04 LAB — CBC WITH DIFFERENTIAL/PLATELET
Basophils Absolute: 0 10*3/uL (ref 0.0–0.1)
Basophils Relative: 0.7 % (ref 0.0–3.0)
Eosinophils Absolute: 0.1 10*3/uL (ref 0.0–0.7)
Eosinophils Relative: 1 % (ref 0.0–5.0)
HCT: 41.8 % (ref 39.0–52.0)
Hemoglobin: 14.1 g/dL (ref 13.0–17.0)
Lymphocytes Relative: 15.7 % (ref 12.0–46.0)
Lymphs Abs: 1 10*3/uL (ref 0.7–4.0)
MCHC: 33.7 g/dL (ref 30.0–36.0)
MCV: 94 fl (ref 78.0–100.0)
Monocytes Absolute: 0.6 10*3/uL (ref 0.1–1.0)
Monocytes Relative: 9.1 % (ref 3.0–12.0)
Neutro Abs: 4.6 10*3/uL (ref 1.4–7.7)
Neutrophils Relative %: 73.5 % (ref 43.0–77.0)
Platelets: 244 10*3/uL (ref 150.0–400.0)
RBC: 4.44 Mil/uL (ref 4.22–5.81)
RDW: 13.4 % (ref 11.5–15.5)
WBC: 6.2 10*3/uL (ref 4.0–10.5)

## 2022-07-04 LAB — COMPREHENSIVE METABOLIC PANEL
ALT: 23 U/L (ref 0–53)
AST: 25 U/L (ref 0–37)
Albumin: 4.6 g/dL (ref 3.5–5.2)
Alkaline Phosphatase: 73 U/L (ref 39–117)
BUN: 17 mg/dL (ref 6–23)
CO2: 26 mEq/L (ref 19–32)
Calcium: 9.6 mg/dL (ref 8.4–10.5)
Chloride: 103 mEq/L (ref 96–112)
Creatinine, Ser: 0.89 mg/dL (ref 0.40–1.50)
GFR: 86.8 mL/min (ref >60.00)
Glucose, Bld: 99 mg/dL (ref 70–99)
Potassium: 4.5 mEq/L (ref 3.5–5.1)
Sodium: 139 mEq/L (ref 135–145)
Total Bilirubin: 0.5 mg/dL (ref 0.2–1.2)
Total Protein: 7.2 g/dL (ref 6.0–8.3)

## 2022-07-04 LAB — LIPID PANEL
Cholesterol: 237 mg/dL — ABNORMAL HIGH (ref 0–200)
HDL: 72.6 mg/dL (ref >39.00)
LDL Cholesterol: 150 mg/dL — ABNORMAL HIGH (ref 0–99)
NonHDL: 164.4
Total CHOL/HDL Ratio: 3
Triglycerides: 73 mg/dL (ref 0.0–149.0)
VLDL: 14.6 mg/dL (ref 0.0–40.0)

## 2022-07-05 ENCOUNTER — Encounter: Payer: Self-pay | Admitting: Family Medicine

## 2022-07-06 NOTE — Addendum Note (Signed)
Addended by: Marin Olp on: 07/06/2022 07:41 PM   Modules accepted: Orders

## 2022-07-09 DIAGNOSIS — H524 Presbyopia: Secondary | ICD-10-CM | POA: Diagnosis not present

## 2022-07-15 DIAGNOSIS — C61 Malignant neoplasm of prostate: Secondary | ICD-10-CM | POA: Diagnosis not present

## 2022-08-06 ENCOUNTER — Other Ambulatory Visit: Payer: Medicare HMO

## 2022-08-12 ENCOUNTER — Ambulatory Visit
Admission: RE | Admit: 2022-08-12 | Discharge: 2022-08-12 | Disposition: A | Discharge status: Home / Self Care | Payer: No Typology Code available for payment source | Source: Ambulatory Visit | Attending: Family Medicine | Admitting: Family Medicine

## 2022-08-12 DIAGNOSIS — E785 Hyperlipidemia, unspecified: Secondary | ICD-10-CM | POA: Diagnosis not present

## 2022-09-30 DIAGNOSIS — D485 Neoplasm of uncertain behavior of skin: Secondary | ICD-10-CM | POA: Diagnosis not present

## 2022-09-30 DIAGNOSIS — D225 Melanocytic nevi of trunk: Secondary | ICD-10-CM | POA: Diagnosis not present

## 2022-09-30 DIAGNOSIS — L814 Other melanin hyperpigmentation: Secondary | ICD-10-CM | POA: Diagnosis not present

## 2022-09-30 DIAGNOSIS — L821 Other seborrheic keratosis: Secondary | ICD-10-CM | POA: Diagnosis not present

## 2022-09-30 DIAGNOSIS — C4441 Basal cell carcinoma of skin of scalp and neck: Secondary | ICD-10-CM | POA: Diagnosis not present

## 2022-10-06 DIAGNOSIS — C61 Malignant neoplasm of prostate: Secondary | ICD-10-CM | POA: Diagnosis not present

## 2022-10-13 DIAGNOSIS — N401 Enlarged prostate with lower urinary tract symptoms: Secondary | ICD-10-CM | POA: Diagnosis not present

## 2022-10-13 DIAGNOSIS — C61 Malignant neoplasm of prostate: Secondary | ICD-10-CM | POA: Diagnosis not present

## 2022-10-13 DIAGNOSIS — R3915 Urgency of urination: Secondary | ICD-10-CM | POA: Diagnosis not present

## 2022-10-13 DIAGNOSIS — R350 Frequency of micturition: Secondary | ICD-10-CM | POA: Diagnosis not present

## 2022-10-23 DIAGNOSIS — C4441 Basal cell carcinoma of skin of scalp and neck: Secondary | ICD-10-CM | POA: Diagnosis not present

## 2023-01-07 DIAGNOSIS — H353132 Nonexudative age-related macular degeneration, bilateral, intermediate dry stage: Secondary | ICD-10-CM | POA: Diagnosis not present

## 2023-02-27 ENCOUNTER — Encounter: Payer: Self-pay | Admitting: Internal Medicine

## 2023-03-16 ENCOUNTER — Ambulatory Visit: Payer: Medicare HMO

## 2023-03-16 VITALS — Wt 197.0 lb

## 2023-03-16 DIAGNOSIS — Z0000 Encounter for general adult medical examination without abnormal findings: Secondary | ICD-10-CM

## 2023-03-16 NOTE — Patient Instructions (Signed)
Randy Rogers , Thank you for taking time to come for your Medicare Wellness Visit. I appreciate your ongoing commitment to your health goals. Please review the following plan we discussed and let me know if I can assist you in the future.   These are the goals we discussed:  Goals      Exercise 150 min/wk Moderate Activity     Continue to stay active with travel and walking     Patient Stated     Keep A1c lower and lose weight     Patient Stated     Stay healthy      Patient Stated     To enjoy life before I pass         This is a list of the screening recommended for you and due dates:  Health Maintenance  Topic Date Due   COVID-19 Vaccine (6 - 2023-24 season) 05/02/2022   Colon Cancer Screening  05/30/2023   Hepatitis C Screening  09/21/2109*   Flu Shot  04/02/2023   Medicare Annual Wellness Visit  03/15/2024   DTaP/Tdap/Td vaccine (3 - Td or Tdap) 11/06/2024   Pneumonia Vaccine  Completed   Zoster (Shingles) Vaccine  Completed   HPV Vaccine  Aged Out  *Topic was postponed. The date shown is not the original due date.    Advanced directives: copies in chart   Conditions/risks identified: enjoy life before I pass   Next appointment: Follow up in one year for your annual wellness visit.   Preventive Care 71 Years and Older, Male  Preventive care refers to lifestyle choices and visits with your health care provider that can promote health and wellness. What does preventive care include? A yearly physical exam. This is also called an annual well check. Dental exams once or twice a year. Routine eye exams. Ask your health care provider how often you should have your eyes checked. Personal lifestyle choices, including: Daily care of your teeth and gums. Regular physical activity. Eating a healthy diet. Avoiding tobacco and drug use. Limiting alcohol use. Practicing safe sex. Taking low doses of aspirin every day. Taking vitamin and mineral supplements as recommended  by your health care provider. What happens during an annual well check? The services and screenings done by your health care provider during your annual well check will depend on your age, overall health, lifestyle risk factors, and family history of disease. Counseling  Your health care provider may ask you questions about your: Alcohol use. Tobacco use. Drug use. Emotional well-being. Home and relationship well-being. Sexual activity. Eating habits. History of falls. Memory and ability to understand (cognition). Work and work Astronomer. Screening  You may have the following tests or measurements: Height, weight, and BMI. Blood pressure. Lipid and cholesterol levels. These may be checked every 5 years, or more frequently if you are over 57 years old. Skin check. Lung cancer screening. You may have this screening every year starting at age 7 if you have a 30-pack-year history of smoking and currently smoke or have quit within the past 15 years. Fecal occult blood test (FOBT) of the stool. You may have this test every year starting at age 52. Flexible sigmoidoscopy or colonoscopy. You may have a sigmoidoscopy every 5 years or a colonoscopy every 10 years starting at age 1. Prostate cancer screening. Recommendations will vary depending on your family history and other risks. Hepatitis C blood test. Hepatitis B blood test. Sexually transmitted disease (STD) testing. Diabetes screening. This is done  by checking your blood sugar (glucose) after you have not eaten for a while (fasting). You may have this done every 1-3 years. Abdominal aortic aneurysm (AAA) screening. You may need this if you are a current or former smoker. Osteoporosis. You may be screened starting at age 65 if you are at high risk. Talk with your health care provider about your test results, treatment options, and if necessary, the need for more tests. Vaccines  Your health care provider may recommend certain  vaccines, such as: Influenza vaccine. This is recommended every year. Tetanus, diphtheria, and acellular pertussis (Tdap, Td) vaccine. You may need a Td booster every 10 years. Zoster vaccine. You may need this after age 12. Pneumococcal 13-valent conjugate (PCV13) vaccine. One dose is recommended after age 68. Pneumococcal polysaccharide (PPSV23) vaccine. One dose is recommended after age 17. Talk to your health care provider about which screenings and vaccines you need and how often you need them. This information is not intended to replace advice given to you by your health care provider. Make sure you discuss any questions you have with your health care provider. Document Released: 09/14/2015 Document Revised: 05/07/2016 Document Reviewed: 06/19/2015 Elsevier Interactive Patient Education  2017 [employer].  Fall Prevention in the Home Falls can cause injuries. They can happen to people of all ages. There are many things you can do to make your home safe and to help prevent falls. What can I do on the outside of my home? Regularly fix the edges of walkways and driveways and fix any cracks. Remove anything that might make you trip as you walk through a door, such as a raised step or threshold. Trim any bushes or trees on the path to your home. Use bright outdoor lighting. Clear any walking paths of anything that might make someone trip, such as rocks or tools. Regularly check to see if handrails are loose or broken. Make sure that both sides of any steps have handrails. Any raised decks and porches should have guardrails on the edges. Have any leaves, snow, or ice cleared regularly. Use sand or salt on walking paths during winter. Clean up any spills in your garage right away. This includes oil or grease spills. What can I do in the bathroom? Use night lights. Install grab bars by the toilet and in the tub and shower. Do not use towel bars as grab bars. Use non-skid mats or decals in  the tub or shower. If you need to sit down in the shower, use a plastic, non-slip stool. Keep the floor dry. Clean up any water that spills on the floor as soon as it happens. Remove soap buildup in the tub or shower regularly. Attach bath mats securely with double-sided non-slip rug tape. Do not have throw rugs and other things on the floor that can make you trip. What can I do in the bedroom? Use night lights. Make sure that you have a light by your bed that is easy to reach. Do not use any sheets or blankets that are too big for your bed. They should not hang down onto the floor. Have a firm chair that has side arms. You can use this for support while you get dressed. Do not have throw rugs and other things on the floor that can make you trip. What can I do in the kitchen? Clean up any spills right away. Avoid walking on wet floors. Keep items that you use a lot in easy-to-reach places. If you need to  reach something above you, use a strong step stool that has a grab bar. Keep electrical cords out of the way. Do not use floor polish or wax that makes floors slippery. If you must use wax, use non-skid floor wax. Do not have throw rugs and other things on the floor that can make you trip. What can I do with my stairs? Do not leave any items on the stairs. Make sure that there are handrails on both sides of the stairs and use them. Fix handrails that are broken or loose. Make sure that handrails are as long as the stairways. Check any carpeting to make sure that it is firmly attached to the stairs. Fix any carpet that is loose or worn. Avoid having throw rugs at the top or bottom of the stairs. If you do have throw rugs, attach them to the floor with carpet tape. Make sure that you have a light switch at the top of the stairs and the bottom of the stairs. If you do not have them, ask someone to add them for you. What else can I do to help prevent falls? Wear shoes that: Do not have high  heels. Have rubber bottoms. Are comfortable and fit you well. Are closed at the toe. Do not wear sandals. If you use a stepladder: Make sure that it is fully opened. Do not climb a closed stepladder. Make sure that both sides of the stepladder are locked into place. Ask someone to hold it for you, if possible. Clearly mark and make sure that you can see: Any grab bars or handrails. First and last steps. Where the edge of each step is. Use tools that help you move around (mobility aids) if they are needed. These include: Canes. Walkers. Scooters. Crutches. Turn on the lights when you go into a dark area. Replace any light bulbs as soon as they burn out. Set up your furniture so you have a clear path. Avoid moving your furniture around. If any of your floors are uneven, fix them. If there are any pets around you, be aware of where they are. Review your medicines with your doctor. Some medicines can make you feel dizzy. This can increase your chance of falling. Ask your doctor what other things that you can do to help prevent falls. This information is not intended to replace advice given to you by your health care provider. Make sure you discuss any questions you have with your health care provider. Document Released: 06/14/2009 Document Revised: 01/24/2016 Document Reviewed: 09/22/2014 Elsevier Interactive Patient Education  2017 [employer].

## 2023-03-16 NOTE — Progress Notes (Signed)
Subjective:   Randy Rogers is a 72 y.o. male who presents for Medicare Annual/Subsequent preventive examination.  Visit Complete: Virtual  I connected with  Randy Rogers on 03/16/23 by a audio enabled telemedicine application and verified that I am speaking with the correct person using two identifiers.  Patient Location: Home  Provider Location: Office/Clinic  I discussed the limitations of evaluation and management by telemedicine. The patient expressed understanding and agreed to proceed.  Review of Systems     Cardiac Risk Factors include: advanced age (>37men, >31 women);male gender     Objective:    Today's Vitals   03/16/23 1130  Weight: 197 lb (89.4 kg)   Body mass index is 28.27 kg/m.     03/16/2023   11:35 AM 02/14/2022    2:15 PM 07/31/2021   11:15 AM 04/30/2021   10:24 AM 04/04/2021    8:54 AM 01/01/2021    2:45 PM 07/16/2020   11:00 AM  Advanced Directives  Does Patient Have a Medical Advance Directive? Yes Yes Yes Yes Yes No Yes  Type of Estate agent of [place];Living will Healthcare Power of Attorney Living will;Healthcare Power of [employer] Power of [place];Living will Living will  Living will  Does patient want to make changes to medical advance directive?     No - Patient declined    Copy of Healthcare Power of Attorney in Chart? Yes - validated most recent copy scanned in chart (See row information) Yes - validated most recent copy scanned in chart (See row information)  No - copy requested     Would patient like information on creating a medical advance directive?      No - Patient declined     Current Medications (verified) Outpatient Encounter Medications as of 03/16/2023  Medication Sig   alfuzosin (UROXATRAL) 10 MG 24 hr tablet    cetirizine (ZYRTEC) 10 MG tablet Take 10 mg by mouth at bedtime.   cholecalciferol (VITAMIN D3) 25 MCG (1000 UNIT) tablet Take 1,000 Units by mouth daily.   fexofenadine (ALLEGRA) 180 MG  tablet Take 180 mg by mouth daily as needed.   fluticasone (FLONASE) 50 MCG/ACT nasal spray Place 2 sprays into both nostrils daily. (Patient taking differently: Place 2 sprays into both nostrils daily as needed.)   ibuprofen (ADVIL,MOTRIN) 200 MG tablet Take 400 mg by mouth every 6 (six) hours as needed.   Multiple Vitamins-Minerals (ICAPS AREDS 2 PO) Take by mouth.   sildenafil (VIAGRA) 100 MG tablet Take 100 mg by mouth daily as needed for erectile dysfunction.   zinc gluconate 50 MG tablet Take 50 mg by mouth daily.   FLUZONE HIGH-DOSE QUADRIVALENT 0.7 ML SUSY    No facility-administered encounter medications on file as of 03/16/2023.    Allergies (verified) Patient has no known allergies.   History: Past Medical History:  Diagnosis Date   Allergic rhinitis, seasonal    Arthritis    ED (erectile dysfunction)    History of adenomatous polyp of colon    History of kidney stones    History of prostatitis 08/2019   History of syncope    per pt episode 2018 (echo in epic 04-27-2017 ef 55-60%, mild LAE) , told vasovagal ,  stated has had previous episode yrs ago had normal work-up,  pt states happens when is stress and needs to eat/ drink   PAC (premature atrial contraction)    Prostate cancer [facility])    urologist-- dr Annabell Howells-- dx 03/ 2022, Gleason 4+4,  PSA 10   Wears glasses    Past Surgical History:  Procedure Laterality Date   COLONOSCOPY     last one 04-27-2017 by gessner   CYSTOSCOPY  04/30/2021   Procedure: CYSTOSCOPY FLEXIBLE;  Surgeon: Bjorn Pippin, MD;  Location: [facility];  Service: Urology;;  NO SEEDS FOUND IN BLADDER   KNEE ARTHROSCOPY Right    late 1970s   PROSTATE BIOPSY     RADIOACTIVE SEED IMPLANT N/A 04/30/2021   Procedure: RADIOACTIVE SEED IMPLANT/BRACHYTHERAPY IMPLANT;  Surgeon: Bjorn Pippin, MD;  Location: [facility];  Service: Urology;  Laterality: N/A;  61 seeds implanted   SPACE OAR INSTILLATION N/A 04/30/2021   Procedure: SPACE  OAR INSTILLATION;  Surgeon: Bjorn Pippin, MD;  Location: [facility];  Service: Urology;  Laterality: N/A;   Family History  Problem Relation Age of Onset   Tuberculosis Mother    Emphysema Maternal Grandfather        smoker   Breast cancer Neg Hx    Prostate cancer Neg Hx    Colon cancer Neg Hx    Pancreatic cancer Neg Hx    Social History   Socioeconomic History   Marital status: Media planner    Spouse name: Not on file   Number of children: 3   Years of education: Not on file   Highest education level: Not on file  Occupational History   Occupation: Retired   Tobacco Use   Smoking status: Never   Smokeless tobacco: Never  Vaping Use   Vaping status: Never Used  Substance and Sexual Activity   Alcohol use: Yes    Alcohol/week: 14.0 standard drinks of alcohol    Types: 14 Standard drinks or equivalent per week   Drug use: Never   Sexual activity: Yes  Other Topics Concern   Not on file  Social History Narrative   Long term partner - living together since 2019. Divorced twice. 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 Youngest daughter Hoyle Barr planned January 2024- update 2023      Adopted by grandparents.      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      Hobbies: music, arts         Social Determinants of Health   Financial Resource Strain: Low Risk  (03/16/2023)   Overall Financial Resource Strain (CARDIA)    Difficulty of Paying Living Expenses: Not hard at all  Food Insecurity: No Food Insecurity (03/16/2023)   Hunger Vital Sign    Worried About Running Out of Food in the Last Year: Never true    Ran Out of Food in the Last Year: Never true  Transportation Needs: No Transportation Needs (03/16/2023)   PRAPARE - Administrator, Civil Service (Medical): No    Lack of Transportation (Non-Medical): No   Physical Activity: Sufficiently Active (03/16/2023)   Exercise Vital Sign    Days of Exercise per Week: 7 days    Minutes of Exercise per Session: 90 min  Stress: No Stress Concern Present (03/16/2023)   [employer] of Occupational Health - Occupational Stress Questionnaire    Feeling of Stress : Not at all  Social Connections: Moderately Integrated (03/16/2023)   Social Connection and Isolation Panel [NHANES]    Frequency of Communication with Friends and Family: More than three times a week    Frequency of Social Gatherings with Friends and Family: More  than three times a week    Attends Religious Services: Never    Active Member of Clubs or Organizations: Yes    Attends Banker Meetings: 1 to 4 times per year    Marital Status: Living with partner    Tobacco Counseling Counseling given: Not Answered   Clinical Intake:  Pre-visit preparation completed: Yes  Pain : No/denies pain     BMI - recorded: 28.27 Nutritional Status: BMI 25 -29 Overweight Nutritional Risks: None Diabetes: No  How often do you need to have someone help you when you read instructions, pamphlets, or other written materials from your doctor or pharmacy?: 1 - Never  Interpreter Needed?: No  Information entered by :: Lanier Ensign, LPN   Activities of Daily Living    03/16/2023   11:32 AM  In your present state of health, do you have any difficulty performing the following activities:  Hearing? 0  Vision? 0  Difficulty concentrating or making decisions? 0  Walking or climbing stairs? 0  Dressing or bathing? 0  Doing errands, shopping? 0  Preparing Food and eating ? N  Using the Toilet? N  In the past six months, have you accidently leaked urine? N  Do you have problems with loss of bowel control? N  Managing your Medications? N  Managing your Finances? N  Housekeeping or managing your Housekeeping? N    Patient Care Team: Shelva Majestic, MD as PCP - General (Family  Medicine) Bjorn Pippin, MD as Attending Physician (Urology) Margaretmary Dys, MD as Consulting Physician (Radiation Oncology) Axel Filler Larna Daughters, NP as Nurse Practitioner (Hematology and Oncology) Maryclare Labrador, RN as Registered Nurse  Indicate any recent Medical Services you may have received from other than [facility] providers in the past year (date may be approximate).     Assessment:   This is a routine wellness examination for Geo.  Hearing/Vision screen Hearing Screening - Comments:: Pt denies any hearing issues  Vision Screening - Comments:: Pt follows up with [facility] for annual eye exams   Dietary issues and exercise activities discussed:     Goals Addressed             This Visit's Progress    Patient Stated       To enjoy life before I pass        Depression Screen    03/16/2023   11:33 AM 02/14/2022    2:14 PM 09/13/2020   10:33 AM 07/16/2020   10:58 AM 07/04/2019    9:05 AM 07/04/2019    9:00 AM 06/02/2018    2:53 PM  PHQ 2/9 Scores  PHQ - 2 Score 0 0 0 0 0 0 0  PHQ- 9 Score     0  0    Fall Risk    03/16/2023   11:36 AM 02/14/2022    2:16 PM 07/16/2020   11:01 AM 07/04/2019    9:00 AM 06/02/2018    2:20 PM  Fall Risk   Falls in the past year? 0 0 0 0 No  Number falls in past yr: 0 0 0    Injury with Fall? 0 0 0 0   Risk for fall due to : Impaired vision Impaired vision Impaired vision    Follow up Falls prevention discussed Falls prevention discussed Falls prevention discussed Falls evaluation completed;Education provided;Falls prevention discussed     MEDICARE RISK AT HOME:  Medicare Risk at Home - 03/16/23 1136  Any stairs in or around the home? Yes    If so, are there any without handrails? No    Home free of loose throw rugs in walkways, pet beds, electrical cords, etc? Yes    Adequate lighting in your home to reduce risk of falls? Yes    Life alert? No    Use of a cane, walker or w/c? No    Grab bars in the bathroom? Yes     Shower chair or bench in shower? Yes    Elevated toilet seat or a handicapped toilet? No             TIMED UP AND GO:  Was the test performed?  No    Cognitive Function:        03/16/2023   11:37 AM 02/14/2022    2:18 PM 07/16/2020   11:03 AM 07/04/2019    9:01 AM 06/02/2018    2:21 PM  6CIT Screen  What Year? 0 points 0 points 0 points 0 points 0 points  What month? 0 points 0 points 0 points 0 points 0 points  What time? 0 points 0 points  0 points 0 points  Count back from 20 0 points 0 points 0 points 0 points 0 points  Months in reverse 0 points 0 points 0 points 0 points 0 points  Repeat phrase 0 points 0 points 0 points 0 points 0 points  Total Score 0 points 0 points  0 points 0 points    Immunizations Immunization History  Administered Date(s) Administered   Fluad Quad(high Dose 65+) 05/11/2019, 04/14/2021   Influenza, High Dose Seasonal PF 05/05/2018, 04/03/2022   Influenza,inj,Quad PF,6+ Mos 05/01/2015, 05/22/2016   Influenza-Unspecified 06/01/2014, 05/14/2017, 04/27/2020, 04/14/2021   Moderna Covid-19 Vaccine Bivalent Booster 58yrs & up 05/08/2021   PFIZER(Purple Top)SARS-COV-2 Vaccination 10/10/2019, 11/03/2019, 05/25/2020   Pfizer Covid-19 Vaccine Bivalent Booster 6yrs & up 01/21/2022   Pneumococcal Conjugate-13 12/16/2016   Pneumococcal Polysaccharide-23 12/30/2017   Td 02/26/2007   Tdap 11/07/2014   Zoster Recombinant(Shingrix) 03/10/2018, 06/24/2018   Zoster, Live 02/22/2013    TDAP status: Up to date  Flu Vaccine status: Up to date  Pneumococcal vaccine status: Up to date  Covid-19 vaccine status: Completed vaccines  Qualifies for Shingles Vaccine? Yes   Zostavax completed Yes   Shingrix Completed?: Yes  Screening Tests Health Maintenance  Topic Date Due   COVID-19 Vaccine (6 - 2023-24 season) 05/02/2022   Colonoscopy  05/30/2023   Hepatitis C Screening  09/21/2109 (Originally 12/14/1968)   INFLUENZA VACCINE  04/02/2023   Medicare  Annual Wellness (AWV)  03/15/2024   DTaP/Tdap/Td (3 - Td or Tdap) 11/06/2024   Pneumonia Vaccine 52+ Years old  Completed   Zoster Vaccines- Shingrix  Completed   HPV VACCINES  Aged Out    Health Maintenance  Health Maintenance Due  Topic Date Due   COVID-19 Vaccine (6 - 2023-24 season) 05/02/2022   Colonoscopy  05/30/2023    Colorectal cancer screening: Type of screening: Colonoscopy. Completed 05/29/16. Repeat every 7 years  Additional Screening:  Hepatitis C Screening: does qualify;  Vision Screening: Recommended annual ophthalmology exams for early detection of glaucoma and other disorders of the eye. Is the patient up to date with their annual eye exam?  Yes  Who is the provider or what is the name of the office in which the patient attends annual eye exams? [facility]  If pt is not established with a provider, would they like to be  referred to a provider to establish care? No .   Dental Screening: Recommended annual dental exams for proper oral hygiene   Community Resource Referral / Chronic Care Management: CRR required this visit?  No   CCM required this visit?  No     Plan:     I have personally reviewed and noted the following in the patient's chart:   Medical and social history Use of alcohol, tobacco or illicit drugs  Current medications and supplements including opioid prescriptions. Patient is not currently taking opioid prescriptions. Functional ability and status Nutritional status Physical activity Advanced directives List of other physicians Hospitalizations, surgeries, and ER visits in previous 12 months Vitals Screenings to include cognitive, depression, and falls Referrals and appointments  In addition, I have reviewed and discussed with patient certain preventive protocols, quality metrics, and best practice recommendations. A written personalized care plan for preventive services as well as general preventive health recommendations were  provided to patient.     Marzella Schlein, LPN   04/29/5620   After Visit Summary: (MyChart) Due to this being a telephonic visit, the after visit summary with patients personalized plan was offered to patient via MyChart   Nurse Notes: none

## 2023-04-10 DIAGNOSIS — C61 Malignant neoplasm of prostate: Secondary | ICD-10-CM | POA: Diagnosis not present

## 2023-04-16 DIAGNOSIS — N400 Enlarged prostate without lower urinary tract symptoms: Secondary | ICD-10-CM | POA: Diagnosis not present

## 2023-04-16 DIAGNOSIS — C61 Malignant neoplasm of prostate: Secondary | ICD-10-CM | POA: Diagnosis not present

## 2023-05-21 ENCOUNTER — Encounter: Payer: Self-pay | Admitting: Family Medicine

## 2023-06-02 ENCOUNTER — Ambulatory Visit (AMBULATORY_SURGERY_CENTER): Payer: Medicare HMO

## 2023-06-02 VITALS — Ht 70.0 in | Wt 196.0 lb

## 2023-06-02 DIAGNOSIS — Z860100 Personal history of colon polyps, unspecified: Secondary | ICD-10-CM

## 2023-06-02 NOTE — Progress Notes (Signed)

## 2023-06-19 ENCOUNTER — Encounter: Payer: Self-pay | Admitting: Internal Medicine

## 2023-06-30 NOTE — Progress Notes (Unsigned)
[facility] History and Physical   Primary Care Physician:  Shelva Majestic, MD   Reason for Procedure:   Hx colon adenoma  Plan:    colonoscopy     HPI: Randy Rogers is a 72 y.o. male here for surveillance exam, s/p removal of adenomatous colon polyp 2017   Past Medical History:  Diagnosis Date   Allergic rhinitis, seasonal    Arthritis    ED (erectile dysfunction)    History of adenomatous polyp of colon    History of kidney stones    History of prostatitis 08/2019   History of syncope    per pt episode 2018 (echo in epic 04-27-2017 ef 55-60%, mild LAE) , told vasovagal ,  stated has had previous episode yrs ago had normal work-up,  pt states happens when is stress and needs to eat/ drink   PAC (premature atrial contraction)    Prostate cancer [facility])    urologist-- dr Annabell Howells-- dx 03/ 2022, Gleason 4+4, PSA 10   Wears glasses     Past Surgical History:  Procedure Laterality Date   COLONOSCOPY     last one 04-27-2017 by Dula Havlik   CYSTOSCOPY  04/30/2021   Procedure: CYSTOSCOPY FLEXIBLE;  Surgeon: Bjorn Pippin, MD;  Location: [facility];  Service: Urology;;  NO SEEDS FOUND IN BLADDER   KNEE ARTHROSCOPY Right    late 1970s   PROSTATE BIOPSY     RADIOACTIVE SEED IMPLANT N/A 04/30/2021   Procedure: RADIOACTIVE SEED IMPLANT/BRACHYTHERAPY IMPLANT;  Surgeon: Bjorn Pippin, MD;  Location: [facility];  Service: Urology;  Laterality: N/A;  61 seeds implanted   SPACE OAR INSTILLATION N/A 04/30/2021   Procedure: SPACE OAR INSTILLATION;  Surgeon: Bjorn Pippin, MD;  Location: [facility];  Service: Urology;  Laterality: N/A;    Prior to Admission medications   Medication Sig Start Date End Date Taking? Authorizing Provider  alfuzosin (UROXATRAL) 10 MG 24 hr tablet  01/08/23   [provider]  cetirizine (ZYRTEC) 10 MG tablet Take 10 mg by mouth at bedtime.    [provider]  cholecalciferol (VITAMIN D3) 25 MCG  (1000 UNIT) tablet Take 1,000 Units by mouth daily.    [provider]  fexofenadine (ALLEGRA) 180 MG tablet Take 180 mg by mouth daily as needed.    [provider]  fluticasone (FLONASE) 50 MCG/ACT nasal spray Place 2 sprays into both nostrils daily. Patient taking differently: Place 2 sprays into both nostrils daily as needed. 04/16/17   Shelva Majestic, MD  FLUZONE HIGH-DOSE QUADRIVALENT 0.7 ML SUSY  04/03/22   [provider]  ibuprofen (ADVIL,MOTRIN) 200 MG tablet Take 400 mg by mouth every 6 (six) hours as needed.    [provider]  MAGNESIUM CITRATE PO Take 2 tablets by mouth daily.    [provider]  Multiple Vitamins-Minerals (ICAPS AREDS 2 PO) Take 2 tablets by mouth daily.    [provider]  sildenafil (VIAGRA) 100 MG tablet Take 100 mg by mouth daily as needed for erectile dysfunction. Patient not taking: Reported on 06/02/2023    [provider]  zinc gluconate 50 MG tablet Take 50 mg by mouth daily. Patient not taking: Reported on 06/02/2023    [provider]    Current Outpatient Medications  Medication Sig Dispense Refill   alfuzosin (UROXATRAL) 10 MG 24 hr tablet      cetirizine (ZYRTEC) 10 MG tablet Take 10 mg by mouth at bedtime.  cholecalciferol (VITAMIN D3) 25 MCG (1000 UNIT) tablet Take 1,000 Units by mouth daily.     fexofenadine (ALLEGRA) 180 MG tablet Take 180 mg by mouth daily as needed.     fluticasone (FLONASE) 50 MCG/ACT nasal spray Place 2 sprays into both nostrils daily. (Patient taking differently: Place 2 sprays into both nostrils daily as needed.) 16 g 3   ibuprofen (ADVIL,MOTRIN) 200 MG tablet Take 400 mg by mouth every 6 (six) hours as needed.     MAGNESIUM CITRATE PO Take 2 tablets by mouth daily.     Multiple Vitamins-Minerals (ICAPS AREDS 2 PO) Take 2 tablets by mouth daily.     FLUZONE HIGH-DOSE QUADRIVALENT 0.7 ML SUSY  (Patient not taking: Reported on 07/01/2023)      sildenafil (VIAGRA) 100 MG tablet Take 100 mg by mouth daily as needed for erectile dysfunction. (Patient not taking: Reported on 06/02/2023)     zinc gluconate 50 MG tablet Take 50 mg by mouth daily. (Patient not taking: Reported on 06/02/2023)     Current Facility-Administered Medications  Medication Dose Route Frequency Provider Last Rate Last Admin   0.9 %  sodium chloride infusion  500 mL Intravenous Once Iva Boop, MD        Allergies as of 07/01/2023   (No Known Allergies)    Family History  Problem Relation Age of Onset   Tuberculosis Mother    Emphysema Maternal Grandfather        smoker   Breast cancer Neg Hx    Prostate cancer Neg Hx    Colon cancer Neg Hx    Pancreatic cancer Neg Hx    Esophageal cancer Neg Hx     Social History   Socioeconomic History   Marital status: Media planner    Spouse name: Not on file   Number of children: 3   Years of education: Not on file   Highest education level: Not on file  Occupational History   Occupation: Retired   Tobacco Use   Smoking status: Never   Smokeless tobacco: Never  Vaping Use   Vaping status: Never Used  Substance and Sexual Activity   Alcohol use: Yes    Alcohol/week: 14.0 standard drinks of alcohol    Types: 14 Standard drinks or equivalent per week   Drug use: Never   Sexual activity: Yes  Other Topics Concern   Not on file  Social History Narrative   Long term partner - living together since 2019. Divorced twice. 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 Youngest daughter Hoyle Barr planned January 2024- update 2023      Adopted by grandparents.      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      Hobbies: music, arts         Social Determinants of Health   Financial Resource Strain: Low Risk  (03/16/2023)   Overall Financial Resource Strain (CARDIA)    Difficulty of  Paying Living Expenses: Not hard at all  Food Insecurity: No Food Insecurity (03/16/2023)   Hunger Vital Sign    Worried About Running Out of Food in the Last Year: Never true    Ran Out of Food in the Last Year: Never true  Transportation Needs: No Transportation Needs (03/16/2023)   PRAPARE - Administrator, Civil Service (Medical): No    Lack of Transportation (Non-Medical): No  Physical Activity: Sufficiently Active (03/16/2023)  Exercise Vital Sign    Days of Exercise per Week: 7 days    Minutes of Exercise per Session: 90 min  Stress: No Stress Concern Present (03/16/2023)   [employer] of Occupational Health - Occupational Stress Questionnaire    Feeling of Stress : Not at all  Social Connections: Moderately Integrated (03/16/2023)   Social Connection and Isolation Panel [NHANES]    Frequency of Communication with Friends and Family: More than three times a week    Frequency of Social Gatherings with Friends and Family: More than three times a week    Attends Religious Services: Never    Database administrator or Organizations: Yes    Attends Banker Meetings: 1 to 4 times per year    Marital Status: Living with partner  Intimate Partner Violence: Not At Risk (03/16/2023)   Humiliation, Afraid, Rape, and Kick questionnaire    Fear of Current or Ex-Partner: No    Emotionally Abused: No    Physically Abused: No    Sexually Abused: No    Review of Systems:  All other review of systems negative except as mentioned in the HPI.  Physical Exam: Vital signs BP (!) 142/75   Pulse 64   Temp 98.1 F (36.7 C)   Ht 5\' 10"  (1.778 m)   Wt 196 lb (88.9 kg)   SpO2 100%   BMI 28.12 kg/m   General:   Alert,  Well-developed, well-nourished, pleasant and cooperative in NAD Lungs:  Clear throughout to auscultation.   Heart:  Regular rate and rhythm; no murmurs, clicks, rubs,  or gallops. Abdomen:  Soft, nontender and nondistended. Normal bowel sounds.    Neuro/Psych:  Alert and cooperative. Normal mood and affect. A and O x 3   @Rigo Letts  Sena Slate, MD, [facility] [phone] (pager) 07/01/2023 8:00 AM@

## 2023-07-01 ENCOUNTER — Ambulatory Visit: Payer: Medicare HMO | Admitting: Internal Medicine

## 2023-07-01 ENCOUNTER — Encounter: Payer: Self-pay | Admitting: Internal Medicine

## 2023-07-01 VITALS — BP 113/59 | HR 49 | Temp 98.1°F | Resp 12 | Ht 70.0 in | Wt 196.0 lb

## 2023-07-01 DIAGNOSIS — Z1211 Encounter for screening for malignant neoplasm of colon: Secondary | ICD-10-CM | POA: Diagnosis not present

## 2023-07-01 DIAGNOSIS — Z09 Encounter for follow-up examination after completed treatment for conditions other than malignant neoplasm: Secondary | ICD-10-CM

## 2023-07-01 DIAGNOSIS — D120 Benign neoplasm of cecum: Secondary | ICD-10-CM

## 2023-07-01 DIAGNOSIS — Z860101 Personal history of adenomatous and serrated colon polyps: Secondary | ICD-10-CM | POA: Diagnosis not present

## 2023-07-01 DIAGNOSIS — Z860100 Personal history of colon polyps, unspecified: Secondary | ICD-10-CM

## 2023-07-01 DIAGNOSIS — D122 Benign neoplasm of ascending colon: Secondary | ICD-10-CM

## 2023-07-01 MED ORDER — SODIUM CHLORIDE 0.9 % IV SOLN: 500.0000 mL | Freq: Once | INTRAVENOUS | Status: DC

## 2023-07-01 NOTE — Op Note (Signed)
[facility] Patient Name: Randy Rogers Procedure Date: 07/01/2023 8:05 AM MRN: [id number] Endoscopist: Iva Boop , MD, [id number] Age: 72 Referring MD:  Date of Birth: 03/13/1951 Gender: Male Account #: [id number] Procedure:                Colonoscopy Indications:              Surveillance: Personal history of adenomatous                            polyps on last colonoscopy > 5 years ago, Last                            colonoscopy: 2017 Medicines:                Monitored Anesthesia Care Procedure:                Pre-Anesthesia Assessment:                           - Prior to the procedure, a History and Physical                            was performed, and patient medications and                            allergies were reviewed. The patient's tolerance of                            previous anesthesia was also reviewed. The risks                            and benefits of the procedure and the sedation                            options and risks were discussed with the patient.                            All questions were answered, and informed consent                            was obtained. Prior Anticoagulants: The patient has                            taken no anticoagulant or antiplatelet agents. ASA                            Grade Assessment: II - A patient with mild systemic                            disease. After reviewing the risks and benefits,                            the patient was deemed in satisfactory condition to  undergo the procedure.                           After obtaining informed consent, the colonoscope                            was passed under direct vision. Throughout the                            procedure, the patient's blood pressure, pulse, and                            oxygen saturations were monitored continuously. The                            CF [id number] #[id number] was introduced through the anus                             and advanced to the the cecum, identified by                            appendiceal orifice and ileocecal valve. The                            colonoscopy was performed without difficulty. The                            patient tolerated the procedure well. The quality                            of the bowel preparation was adequate. The                            ileocecal valve, appendiceal orifice, and rectum                            were photographed. The bowel preparation used was                            Miralax via split dose instruction. Scope In: 8:08:52 AM Scope Out: 8:27:45 AM Scope Withdrawal Time: 0 hours 14 minutes 29 seconds  Total Procedure Duration: 0 hours 18 minutes 53 seconds  Findings:                 The perianal and digital rectal examinations were                            normal.                           Two sessile polyps were found in the ascending                            colon and cecum. The polyps were diminutive in  size. These polyps were removed with a cold snare.                            Resection and retrieval were complete. Verification                            of patient identification for the specimen was                            done. Estimated blood loss was minimal.                           Internal hemorrhoids were found.                           The exam was otherwise without abnormality on                            direct and retroflexion views. Complications:            No immediate complications. Estimated Blood Loss:     Estimated blood loss was minimal. Impression:               - Two diminutive polyps in the ascending colon and                            in the cecum, removed with a cold snare. Resected                            and retrieved.                           - Internal hemorrhoids.                           - The examination was otherwise normal on direct                             and retroflexion views.                           - Personal history of colonic polyps. 2006 5 mm                            polyp                           2012 8 mm adenoma - cecum                           05/29/2016 diminutive cecal and transverse polyps                            removed 1 adenoma and 1 mucosal polyp Recommendation:           - Patient has a contact number available for  emergencies. The signs and symptoms of potential                            delayed complications were discussed with the                            patient. Return to normal activities tomorrow.                            Written discharge instructions were provided to the                            patient.                           - Resume previous diet.                           - Continue present medications.                           - Await pathology results.                           - No recommendation at this time regarding repeat                            colonoscopy due to age. Iva Boop, MD 07/01/2023 8:39:06 AM This report has been signed electronically.

## 2023-07-01 NOTE — Patient Instructions (Addendum)
I found and removed 2 tiny polyps. Internal hemorrhoids swollen also.  I will let you know pathology results and when/if to have another routine colonoscopy by mail and/or My Chart.  I appreciate the opportunity to care for you. Iva Boop, MD, [facility]  Handouts provided on polyps and hemorrhoids.  Resume previous diet.  Continue present medications.  Await pathology results.  No recommendation at this time regarding repeat colonoscopy due to age.   YOU HAD AN ENDOSCOPIC PROCEDURE TODAY AT THE [facility]:   Refer to the procedure report that was given to you for any specific questions about what was found during the examination.  If the procedure report does not answer your questions, please call your gastroenterologist to clarify.  If you requested that your care partner not be given the details of your procedure findings, then the procedure report has been included in a sealed envelope for you to review at your convenience later.  YOU SHOULD EXPECT: Some feelings of bloating in the abdomen. Passage of more gas than usual.  Walking can help get rid of the air that was put into your GI tract during the procedure and reduce the bloating. If you had a lower endoscopy (such as a colonoscopy or flexible sigmoidoscopy) you may notice spotting of blood in your stool or on the toilet paper. If you underwent a bowel prep for your procedure, you may not have a normal bowel movement for a few days.  Please Note:  You might notice some irritation and congestion in your nose or some drainage.  This is from the oxygen used during your procedure.  There is no need for concern and it should clear up in a day or so.  SYMPTOMS TO REPORT IMMEDIATELY:  Following lower endoscopy (colonoscopy or flexible sigmoidoscopy):  Excessive amounts of blood in the stool  Significant tenderness or worsening of abdominal pains  Swelling of the abdomen that is new, acute  Fever of 100F or higher  For  urgent or emergent issues, a gastroenterologist can be reached at any hour by calling [phone]. Do not use MyChart messaging for urgent concerns.    DIET:  We do recommend a small meal at first, but then you may proceed to your regular diet.  Drink plenty of fluids but you should avoid alcoholic beverages for 24 hours.  ACTIVITY:  You should plan to take it easy for the rest of today and you should NOT DRIVE or use heavy machinery until tomorrow (because of the sedation medicines used during the test).    FOLLOW UP: Our staff will call the number listed on your records the next business day following your procedure.  We will call around 7:15- 8:00 am to check on you and address any questions or concerns that you may have regarding the information given to you following your procedure. If we do not reach you, we will leave a message.     If any biopsies were taken you will be contacted by phone or by letter within the next 1-3 weeks.  Please call us at [phone] if you have not heard about the biopsies in 3 weeks.    SIGNATURES/CONFIDENTIALITY: You and/or your care partner have signed paperwork which will be entered into your electronic medical record.  These signatures attest to the fact that that the information above on your After Visit Summary has been reviewed and is understood.  Full responsibility of the confidentiality of this discharge information lies with you  and/or your care-partner.

## 2023-07-01 NOTE — Progress Notes (Signed)
Sedate, gd SR, tolerated procedure well, VSS, report to RN 

## 2023-07-01 NOTE — Progress Notes (Signed)
Pt's states no medical or surgical changes since previsit or office visit. 

## 2023-07-02 ENCOUNTER — Telehealth: Payer: Self-pay | Admitting: *Deleted

## 2023-07-02 NOTE — Telephone Encounter (Signed)
No answer on  follow up call. Left message.   

## 2023-07-03 ENCOUNTER — Ambulatory Visit (INDEPENDENT_AMBULATORY_CARE_PROVIDER_SITE_OTHER): Payer: Medicare HMO | Admitting: Family Medicine

## 2023-07-03 ENCOUNTER — Encounter: Payer: Self-pay | Admitting: Internal Medicine

## 2023-07-03 ENCOUNTER — Other Ambulatory Visit: Payer: Self-pay | Admitting: Medical Genetics

## 2023-07-03 ENCOUNTER — Encounter: Payer: Self-pay | Admitting: Family Medicine

## 2023-07-03 VITALS — BP 138/70 | HR 55 | Temp 97.0°F | Ht 70.0 in | Wt 202.4 lb

## 2023-07-03 DIAGNOSIS — Z006 Encounter for examination for normal comparison and control in clinical research program: Secondary | ICD-10-CM

## 2023-07-03 DIAGNOSIS — Z0000 Encounter for general adult medical examination without abnormal findings: Secondary | ICD-10-CM | POA: Diagnosis not present

## 2023-07-03 DIAGNOSIS — C61 Malignant neoplasm of prostate: Secondary | ICD-10-CM

## 2023-07-03 DIAGNOSIS — E785 Hyperlipidemia, unspecified: Secondary | ICD-10-CM

## 2023-07-03 LAB — LIPID PANEL
Cholesterol: 188 mg/dL (ref 0–200)
HDL: 64.6 mg/dL (ref >39.00)
LDL Cholesterol: 110 mg/dL — ABNORMAL HIGH (ref 0–99)
NonHDL: 123.43
Total CHOL/HDL Ratio: 3
Triglycerides: 66 mg/dL (ref 0.0–149.0)
VLDL: 13.2 mg/dL (ref 0.0–40.0)

## 2023-07-03 LAB — CBC WITH DIFFERENTIAL/PLATELET
Basophils Absolute: 0.1 10*3/uL (ref 0.0–0.1)
Basophils Relative: 0.9 % (ref 0.0–3.0)
Eosinophils Absolute: 0.2 10*3/uL (ref 0.0–0.7)
Eosinophils Relative: 3.1 % (ref 0.0–5.0)
HCT: 42.6 % (ref 39.0–52.0)
Hemoglobin: 14 g/dL (ref 13.0–17.0)
Lymphocytes Relative: 18.9 % (ref 12.0–46.0)
Lymphs Abs: 1 10*3/uL (ref 0.7–4.0)
MCHC: 32.7 g/dL (ref 30.0–36.0)
MCV: 95.8 fL (ref 78.0–100.0)
Monocytes Absolute: 0.5 10*3/uL (ref 0.1–1.0)
Monocytes Relative: 9.6 % (ref 3.0–12.0)
Neutro Abs: 3.7 10*3/uL (ref 1.4–7.7)
Neutrophils Relative %: 67.5 % (ref 43.0–77.0)
Platelets: 235 10*3/uL (ref 150.0–400.0)
RBC: 4.45 Mil/uL (ref 4.22–5.81)
RDW: 13.8 % (ref 11.5–15.5)
WBC: 5.5 10*3/uL (ref 4.0–10.5)

## 2023-07-03 LAB — COMPREHENSIVE METABOLIC PANEL
ALT: 38 U/L (ref 0–53)
AST: 35 U/L (ref 0–37)
Albumin: 4.5 g/dL (ref 3.5–5.2)
Alkaline Phosphatase: 76 U/L (ref 39–117)
BUN: 21 mg/dL (ref 6–23)
CO2: 28 meq/L (ref 19–32)
Calcium: 9.5 mg/dL (ref 8.4–10.5)
Chloride: 105 meq/L (ref 96–112)
Creatinine, Ser: 0.9 mg/dL (ref 0.40–1.50)
GFR: 85.9 mL/min (ref >60.00)
Glucose, Bld: 108 mg/dL — ABNORMAL HIGH (ref 70–99)
Potassium: 4.8 meq/L (ref 3.5–5.1)
Sodium: 140 meq/L (ref 135–145)
Total Bilirubin: 0.4 mg/dL (ref 0.2–1.2)
Total Protein: 7.4 g/dL (ref 6.0–8.3)

## 2023-07-03 LAB — SURGICAL PATHOLOGY

## 2023-07-03 NOTE — Patient Instructions (Addendum)
Please stop by lab before you go If you have mychart- we will send your results within 3 business days of [place] receiving them.  If you do not have mychart- we will call you about results within 5 business days of [place] receiving them.  *please also note that you will see labs on mychart as soon as they post. I will later go in and write notes on them- will say "notes from Dr. Durene Cal"   Recommended follow up: Return in about 1 year (around 07/02/2024) for physical or sooner if needed.Schedule b4 you leave.

## 2023-07-03 NOTE — Progress Notes (Signed)
Phone: [phone]   Subjective:  Patient presents today for their annual physical. Chief complaint-noted.   See problem oriented charting- ROS- full  review of systems was completed and negative  except for: hot flashes, low libido, reduced testicle and penile size   The following were reviewed and entered/updated in epic: Past Medical History:  Diagnosis Date   Allergic rhinitis, seasonal    Allergy    Arthritis    ED (erectile dysfunction)    History of adenomatous polyp of colon    History of kidney stones    History of prostatitis 08/2019   History of syncope    per pt episode 2018 (echo in epic 04-27-2017 ef 55-60%, mild LAE) , told vasovagal ,  stated has had previous episode yrs ago had normal work-up,  pt states happens when is stress and needs to eat/ drink   Hyperlipidemia    PAC (premature atrial contraction)    Prostate cancer [facility])    urologist-- dr Annabell Howells-- dx 03/ 2022, Gleason 4+4, PSA 10   Wears glasses    Patient Active Problem List   Diagnosis Date Noted   Malignant neoplasm of prostate (HCC) 01/01/2021    Priority: High   Hyperlipidemia 06/14/2014    Priority: Medium    PAC (premature atrial contraction) 07/04/2019    Priority: Low   Actinic keratosis 01/07/2010    Priority: Low   ALLERGIC RHINITIS, SEASONAL 06/03/2007    Priority: Low   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    Priority: Low   History of colonic polyps 03/04/2007    Priority: Low   Syncope 04/16/2017   Past Surgical History:  Procedure Laterality Date   COLONOSCOPY     last one 04-27-2017 by gessner   CYSTOSCOPY  04/30/2021   Procedure: CYSTOSCOPY FLEXIBLE;  Surgeon: Bjorn Pippin, MD;  Location: [facility] [place];  Service: Urology;;  NO SEEDS FOUND IN BLADDER   KNEE ARTHROSCOPY Right    late 1970s   PROSTATE BIOPSY     RADIOACTIVE SEED IMPLANT N/A 04/30/2021   Procedure: RADIOACTIVE SEED IMPLANT/BRACHYTHERAPY IMPLANT;  Surgeon: Bjorn Pippin, MD;  Location: [facility];  Service: Urology;  Laterality: N/A;  61 seeds implanted   SPACE OAR INSTILLATION N/A 04/30/2021   Procedure: SPACE OAR INSTILLATION;  Surgeon: Bjorn Pippin, MD;  Location: [facility];  Service: Urology;  Laterality: N/A;    Family History  Problem Relation Age of Onset   Tuberculosis Mother    Arthritis Maternal Grandmother    Emphysema Maternal Grandfather        smoker   Breast cancer Neg Hx    Prostate cancer Neg Hx    Colon cancer Neg Hx    Pancreatic cancer Neg Hx    Esophageal cancer Neg Hx     Medications- reviewed and updated Current Outpatient Medications  Medication Sig Dispense Refill   alfuzosin (UROXATRAL) 10 MG 24 hr tablet      cetirizine (ZYRTEC) 10 MG tablet Take 10 mg by mouth at bedtime.     cholecalciferol (VITAMIN D3) 25 MCG (1000 UNIT) tablet Take 1,000 Units by mouth daily.     fexofenadine (ALLEGRA) 180 MG tablet Take 180 mg by mouth daily as needed.     fluticasone (FLONASE) 50 MCG/ACT nasal spray Place 2 sprays into both nostrils daily. (Patient taking differently: Place 2 sprays into both nostrils daily as needed.) 16 g 3   ibuprofen (ADVIL,MOTRIN) 200 MG tablet Take 400 mg by mouth every 6 (  six) hours as needed.     MAGNESIUM CITRATE PO Take 2 tablets by mouth daily.     Multiple Vitamins-Minerals (ICAPS AREDS 2 PO) Take 2 tablets by mouth daily.     No current facility-administered medications for this visit.    Allergies-reviewed and updated No Known Allergies  Social History   Social History Narrative   Long term partner - living together since 2019. Divorced twice. 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 Youngest daughter Hoyle Barr planned January 2024- applying late 2024      Adopted by grandparents.      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 Oropharynx normal Neck: no thyromegaly CV: bradycardic but regular no murmurs rubs or gallops Lungs: CTAB no crackles, wheeze, rhonchi Abdomen: soft/nontender/nondistended/normal bowel sounds. No rebound or guarding.  Ext: no edema Skin: warm, dry Neuro: grossly normal, moves all extremities, PERRLA    Assessment and Plan  72 y.o. male presenting for annual physical.  Health Maintenance counseling: 1. Anticipatory guidance: Patient counseled regarding regular dental exams -q6 months, eye exams -yearly,  avoiding smoking and second hand smoke , limiting alcohol to 2 beverages per day - about 10 per week other than with travel, no illicit drugs .   2. Risk factor reduction:  Advised patient of need for regular exercise and diet rich and fruits and vegetables to reduce risk of heart attack and stroke.  Exercise- still with 4-9 miles most days on step counter. 11k steps Diet/weight management-weight up 5 pounds from last physical but just had long [place] trip- plans to improve diet  Wt Readings from Last 3 Encounters:  07/03/23 202 lb 6.4 oz (91.8 kg)  07/01/23 196 lb (88.9 kg)  06/02/23 196 lb (88.9 kg)  3. Immunizations/screenings/ancillary studies- opts out of COVID-19 vaccination but otherwise up-to-date  Immunization History  Administered Date(s) Administered   Fluad Quad(high Dose 65+) 05/11/2019, 04/14/2021   Influenza, High Dose Seasonal PF 05/05/2018, 04/03/2022, 04/20/2023   Influenza,inj,Quad PF,6+ Mos 05/01/2015, 05/22/2016   Influenza-Unspecified 06/01/2014, 05/14/2017, 04/27/2020, 04/14/2021   Moderna Covid-19 Vaccine Bivalent Booster 45yrs & up 05/08/2021   PFIZER Comirnaty(Gray Top)Covid-19 Tri-Sucrose Vaccine 04/20/2023   PFIZER(Purple Top)SARS-COV-2  Vaccination 10/10/2019, 11/03/2019, 05/25/2020   Pfizer Covid-19 Vaccine Bivalent Booster 39yrs & up 01/21/2022   Pneumococcal Conjugate-13 12/16/2016   Pneumococcal Polysaccharide-23 12/30/2017   Td 02/26/2007   Tdap 11/07/2014   Zoster Recombinant(Shingrix) 03/10/2018, 06/24/2018   Zoster, Live 02/22/2013   4. Prostate cancer screening-see discussion below  5. Colon cancer screening - just had colonoscopy and had tubular adenoma- waiting on Dr. Leone Payor for next follow up date recommendation 6. Skin cancer screening- Dr. Melida Quitter yearly- Mohs required on neck earlier this year. advised regular sunscreen use. Denies worrisome, changing, or new skin lesions.  7. Smoking associated screening (lung cancer screening, AAA screen 65-75, UA)- never smoker 8. STD screening - long term partner- opts out  Status of chronic or acute concerns   #prostate cancer (initial biopsy benign 11/25/2019 but later found to be cancer  in 2022)-  followed with Dr. Retta Diones now Dr. Liliane Shi- history of radiation and androgen deprivation -   -Does require alfuzosin -testosterone and PSA undetectable   #hyperlipidemia-CT calcium scoring score of 0 08/13/2022 S: Medication:none  -In addition he has low interest in statin A/P: overall low risk with prior CT calcium- update lipids but hold off on statin    #Pulmonary nodule on CT calcium scoring- discussed possible CT and he opts out   #mild bradycardia- years of issues of running in 50's- will continue to monitor- is asymptomatic   #BP at home 120/60- white coat element ut better than usual today in office   Recommended follow up: Return in about 1 year (around 07/02/2024) for physical or sooner if needed.Schedule b4 you leave. Future Appointments  Date Time Provider Department Center  03/21/2024 11:45 AM [facility] ANNUAL WELLNESS VISIT 1 [facility]   Lab/Order associations: fasting   ICD-10-CM   1. Preventative health care  Z00.00     2. Hyperlipidemia,  unspecified hyperlipidemia type  E78.5 Comprehensive metabolic panel    CBC with Differential/Platelet    Lipid panel    3. Prostate cancer (HCC)  C61       No orders of the defined types were placed in this encounter.   Return precautions advised.  Tana Conch, MD

## 2023-07-08 ENCOUNTER — Encounter: Payer: Self-pay | Admitting: Family Medicine

## 2023-07-13 DIAGNOSIS — H25813 Combined forms of age-related cataract, bilateral: Secondary | ICD-10-CM | POA: Diagnosis not present

## 2023-07-13 DIAGNOSIS — H524 Presbyopia: Secondary | ICD-10-CM | POA: Diagnosis not present

## 2023-07-13 DIAGNOSIS — H2513 Age-related nuclear cataract, bilateral: Secondary | ICD-10-CM | POA: Diagnosis not present

## 2023-07-13 DIAGNOSIS — H5203 Hypermetropia, bilateral: Secondary | ICD-10-CM | POA: Diagnosis not present

## 2023-07-13 DIAGNOSIS — H52223 Regular astigmatism, bilateral: Secondary | ICD-10-CM | POA: Diagnosis not present

## 2023-07-28 ENCOUNTER — Other Ambulatory Visit (HOSPITAL_COMMUNITY)
Admission: RE | Admit: 2023-07-28 | Discharge: 2023-07-28 | Disposition: A | Discharge status: Home / Self Care | Payer: Self-pay | Source: Ambulatory Visit | Attending: Oncology | Admitting: Oncology

## 2023-07-28 DIAGNOSIS — Z006 Encounter for examination for normal comparison and control in clinical research program: Secondary | ICD-10-CM | POA: Insufficient documentation

## 2023-08-09 LAB — GENECONNECT MOLECULAR SCREEN: Genetic Analysis Overall Interpretation: NEGATIVE

## 2023-10-06 DIAGNOSIS — L814 Other melanin hyperpigmentation: Secondary | ICD-10-CM | POA: Diagnosis not present

## 2023-10-06 DIAGNOSIS — L821 Other seborrheic keratosis: Secondary | ICD-10-CM | POA: Diagnosis not present

## 2023-10-06 DIAGNOSIS — Z08 Encounter for follow-up examination after completed treatment for malignant neoplasm: Secondary | ICD-10-CM | POA: Diagnosis not present

## 2023-10-06 DIAGNOSIS — D225 Melanocytic nevi of trunk: Secondary | ICD-10-CM | POA: Diagnosis not present

## 2023-10-06 DIAGNOSIS — Z85828 Personal history of other malignant neoplasm of skin: Secondary | ICD-10-CM | POA: Diagnosis not present

## 2023-10-12 DIAGNOSIS — C61 Malignant neoplasm of prostate: Secondary | ICD-10-CM | POA: Diagnosis not present

## 2024-03-21 ENCOUNTER — Ambulatory Visit (INDEPENDENT_AMBULATORY_CARE_PROVIDER_SITE_OTHER): Payer: Medicare HMO

## 2024-03-21 VITALS — Ht 70.0 in | Wt 196.0 lb

## 2024-03-21 DIAGNOSIS — Z0000 Encounter for general adult medical examination without abnormal findings: Secondary | ICD-10-CM | POA: Diagnosis not present

## 2024-03-21 NOTE — Patient Instructions (Signed)
 Mr. Rippeon , Thank you for taking time out of your busy schedule to complete your Annual Wellness Visit with me. I enjoyed our conversation and look forward to speaking with you again next year. I, as well as your care team,  appreciate your ongoing commitment to your health goals. Please review the following plan we discussed and let me know if I can assist you in the future. Your Game plan/ To Do List    Referrals: If you haven't heard from the office you've been referred to, please reach out to them at the phone provided.   Follow up Visits: Next Medicare AWV with our clinical staff: 03/22/25   Have you seen your provider in the last 6 months (3 months if uncontrolled diabetes)? No Next Office Visit with your provider: 07/04/24  Clinician Recommendations:  Aim for 30 minutes of exercise or brisk walking, 6-8 glasses of water , and 5 servings of fruits and vegetables each day.       This is a list of the screening recommended for you and due dates:  Health Maintenance  Topic Date Due   COVID-19 Vaccine (8 - Pfizer risk 2024-25 season) 01/23/2024   Medicare Annual Wellness Visit  03/15/2024   Hepatitis C Screening  09/21/2109*   Flu Shot  04/01/2024   DTaP/Tdap/Td vaccine (3 - Td or Tdap) 11/06/2024   Pneumococcal Vaccine for age over 59  Completed   Zoster (Shingles) Vaccine  Completed   Hepatitis B Vaccine  Aged Out   HPV Vaccine  Aged Out   Meningitis B Vaccine  Aged Out   Colon Cancer Screening  Discontinued  *Topic was postponed. The date shown is not the original due date.    Advanced directives: (In Chart) A copy of your advanced directives are scanned into your chart should your provider ever need it. Advance Care Planning is important because it:  [x]  Makes sure you receive the medical care that is consistent with your values, goals, and preferences  [x]  It provides guidance to your family and loved ones and reduces their decisional burden about whether or not they are  making the right decisions based on your wishes.  Follow the link provided in your after visit summary or read over the paperwork we have mailed to you to help you started getting your Advance Directives in place. If you need assistance in completing these, please reach out to us  so that we can help you!  See attachments for Preventive Care and Fall Prevention Tips.

## 2024-03-21 NOTE — Progress Notes (Signed)
 Subjective:   Randy Rogers is a 73 y.o. who presents for a Medicare Wellness preventive visit.  As a reminder, Annual Wellness Visits don't include a physical exam, and some assessments may be limited, especially if this visit is performed virtually. We may recommend an in-person follow-up visit with your provider if needed.  Visit Complete: Virtual I connected with  [name] on 03/21/24 by a audio enabled telemedicine application and verified that I am speaking with the correct person using two identifiers.  Patient Location: Home  Provider Location: Office/Clinic  I discussed the limitations of evaluation and management by telemedicine. The patient expressed understanding and agreed to proceed.  Vital Signs: Because this visit was a virtual/telehealth visit, some criteria may be missing or patient reported. Any vitals not documented were not able to be obtained and vitals that have been documented are patient reported.  VideoDeclined- This patient declined Librarian, academic. Therefore the visit was completed with audio only.  Persons Participating in Visit: Patient.  AWV Questionnaire: No: Patient Medicare AWV questionnaire was not completed prior to this visit.  Cardiac Risk Factors include: advanced age (>62men, >81 women);dyslipidemia;male gender     Objective:    Today's Vitals   03/21/24 1118  Weight: 196 lb (88.9 kg)  Height: 5' 10 (1.778 m)   Body mass index is 28.12 kg/m.     03/21/2024   11:20 AM 03/16/2023   11:35 AM 02/14/2022    2:15 PM 07/31/2021   11:15 AM 04/30/2021   10:24 AM 04/04/2021    8:54 AM 01/01/2021    2:45 PM  Advanced Directives  Does Patient Have a Medical Advance Directive? Yes Yes Yes Yes Yes Yes No  Type of Estate agent of [place];Living will Healthcare Power of [place];Living will Healthcare Power of Attorney Living will;Healthcare Power of [employer] Power of [place];Living will  Living will   Does patient want to make changes to medical advance directive? No - Patient declined     No - Patient declined   Copy of Healthcare Power of Attorney in Chart? Yes - validated most recent copy scanned in chart (See row information) Yes - validated most recent copy scanned in chart (See row information) Yes - validated most recent copy scanned in chart (See row information)  No - copy requested    Would patient like information on creating a medical advance directive?       No - Patient declined    Current Medications (verified) Outpatient Encounter Medications as of 03/21/2024  Medication Sig   alfuzosin (UROXATRAL) 10 MG 24 hr tablet    cetirizine (ZYRTEC) 10 MG tablet Take 10 mg by mouth at bedtime.   cholecalciferol (VITAMIN D3) 25 MCG (1000 UNIT) tablet Take 1,000 Units by mouth daily.   fexofenadine (ALLEGRA) 180 MG tablet Take 180 mg by mouth daily as needed.   fluticasone  (FLONASE ) 50 MCG/ACT nasal spray Place 2 sprays into both nostrils daily.   ibuprofen (ADVIL,MOTRIN) 200 MG tablet Take 400 mg by mouth every 6 (six) hours as needed.   MAGNESIUM CITRATE PO Take 2 tablets by mouth daily.   melatonin 5 MG TABS Take 5 mg by mouth.   Multiple Vitamins-Minerals (ICAPS AREDS 2 PO) Take 2 tablets by mouth daily.   No facility-administered encounter medications on file as of 03/21/2024.    Allergies (verified) Patient has no known allergies.   History: Past Medical History:  Diagnosis Date   Allergic rhinitis, seasonal  Allergy    Arthritis    ED (erectile dysfunction)    History of adenomatous polyp of colon    History of kidney stones    History of prostatitis 08/2019   History of syncope    per pt episode 2018 (echo in epic 04-27-2017 ef 55-60%, mild LAE) , told vasovagal ,  stated has had previous episode yrs ago had normal work-up,  pt states happens when is stress and needs to eat/ drink   Hyperlipidemia    PAC (premature atrial contraction)    Prostate  cancer [facility])    urologist-- dr watt-- dx 03/ 2022, Gleason 4+4, PSA 10   Wears glasses    Past Surgical History:  Procedure Laterality Date   COLONOSCOPY     last one 04-27-2017 by gessner   CYSTOSCOPY  04/30/2021   Procedure: CYSTOSCOPY FLEXIBLE;  Surgeon: watt Rush, MD;  Location: [facility];  Service: Urology;;  NO SEEDS FOUND IN BLADDER   KNEE ARTHROSCOPY Right    late 1970s   PROSTATE BIOPSY     RADIOACTIVE SEED IMPLANT N/A 04/30/2021   Procedure: RADIOACTIVE SEED IMPLANT/BRACHYTHERAPY IMPLANT;  Surgeon: watt Rush, MD;  Location: [facility];  Service: Urology;  Laterality: N/A;  61 seeds implanted   SPACE OAR INSTILLATION N/A 04/30/2021   Procedure: SPACE OAR INSTILLATION;  Surgeon: watt Rush, MD;  Location: [facility];  Service: Urology;  Laterality: N/A;   Family History  Problem Relation Age of Onset   Tuberculosis Mother    Arthritis Maternal Grandmother    Emphysema Maternal Grandfather        smoker   Breast cancer Neg Hx    Prostate cancer Neg Hx    Colon cancer Neg Hx    Pancreatic cancer Neg Hx    Esophageal cancer Neg Hx    Social History   Socioeconomic History   Marital status: Media planner    Spouse name: Not on file   Number of children: 3   Years of education: Not on file   Highest education level: Not on file  Occupational History   Occupation: Retired   Tobacco Use   Smoking status: Never   Smokeless tobacco: Never  Vaping Use   Vaping status: Never Used  Substance and Sexual Activity   Alcohol use: Yes    Alcohol/week: 10.0 standard drinks of alcohol    Types: 4 Glasses of wine, 6 Shots of liquor per week   Drug use: Never   Sexual activity: Not Currently  Other Topics Concern   Not on file  Social History Narrative   Long term partner - living together since 2019. Divorced twice. 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 Youngest daughter mela planned January 2024- applying late 2024      Adopted by grandparents.      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      Hobbies: music, arts         Social Drivers of [employer]: Low Risk  (03/21/2024)   Overall Financial Resource Strain (CARDIA)    Difficulty of Paying Living Expenses: Not hard at all  Food Insecurity: No Food Insecurity (03/21/2024)   Hunger Vital Sign    Worried About Running Out of Food in the Last Year: Never true    Ran Out of Food in the Last Year: Never true  Transportation Needs:  No Transportation Needs (03/21/2024)   PRAPARE - Administrator, Civil Service (Medical): No    Lack of Transportation (Non-Medical): No  Physical Activity: Sufficiently Active (03/21/2024)   Exercise Vital Sign    Days of Exercise per Week: 7 days    Minutes of Exercise per Session: 90 min  Stress: No Stress Concern Present (03/21/2024)   [employer] of Occupational Health - Occupational Stress Questionnaire    Feeling of Stress: Not at all  Social Connections: Moderately Integrated (03/21/2024)   Social Connection and Isolation Panel    Frequency of Communication with Friends and Family: More than three times a week    Frequency of Social Gatherings with Friends and Family: More than three times a week    Attends Religious Services: Never    Database administrator or Organizations: Yes    Attends Engineer, structural: 1 to 4 times per year    Marital Status: Living with partner    Tobacco Counseling Counseling given: Not Answered    Clinical Intake:  Pre-visit preparation completed: Yes  Pain : No/denies pain     BMI - recorded: 28.12 Nutritional Status: BMI 25 -29 Overweight Nutritional Risks: None Diabetes: No  Lab Results  Component Value Date   HGBA1C 5.2 09/24/2015     How often do you need to have  someone help you when you read instructions, pamphlets, or other written materials from your doctor or pharmacy?: 1 - Never  Interpreter Needed?: No  Information entered by :: Ellouise Haws, LPN   Activities of Daily Living     03/21/2024   11:20 AM  In your present state of health, do you have any difficulty performing the following activities:  Hearing? 0  Vision? 0  Difficulty concentrating or making decisions? 0  Walking or climbing stairs? 0  Dressing or bathing? 0  Doing errands, shopping? 0  Preparing Food and eating ? N  Using the Toilet? N  In the past six months, have you accidently leaked urine? N  Do you have problems with loss of bowel control? N  Managing your Medications? N  Managing your Finances? N  Housekeeping or managing your Housekeeping? N    Patient Care Team: Katrinka Garnette KIDD, MD as PCP - General (Family Medicine) Watt Rush, MD as Attending Physician (Urology) Patrcia Cough, MD as Consulting Physician (Radiation Oncology) Crawford Morna Pickle, NP as Nurse Practitioner (Hematology and Oncology) Starla Wendelyn BIRCH, RN as Registered Nurse  I have updated your Care Teams any recent Medical Services you may have received from other providers in the past year.     Assessment:   This is a routine wellness examination for Gustin.  Hearing/Vision screen Hearing Screening - Comments:: Denies any hearing issues  Vision Screening - Comments:: Wears rx glasses - up to date with routine eye exams with [facility] , will be changing providers    Goals Addressed             This Visit's Progress    Patient Stated       Maintain health and activity        Depression Screen     03/21/2024   11:21 AM 03/16/2023   11:33 AM 02/14/2022    2:14 PM 09/13/2020   10:33 AM 07/16/2020   10:58 AM 07/04/2019    9:05 AM 07/04/2019    9:00 AM  PHQ 2/9 Scores  PHQ - 2 Score 0 0 0 0  0 0 0  PHQ- 9 Score      0     Fall Risk     03/21/2024   11:24  AM 03/16/2023   11:36 AM 02/14/2022    2:16 PM 07/16/2020   11:01 AM 07/04/2019    9:00 AM  Fall Risk   Falls in the past year? 0 0 0 0 0   Number falls in past yr: 0 0 0 0   Injury with Fall? 0 0 0 0 0  Risk for fall due to : No Fall Risks Impaired vision Impaired vision Impaired vision   Follow up Falls prevention discussed Falls prevention discussed Falls prevention discussed  Falls prevention discussed  Falls evaluation completed;Education provided;Falls prevention discussed      Data saved with a previous flowsheet row definition    MEDICARE RISK AT HOME:  Medicare Risk at Home Any stairs in or around the home?: Yes If so, are there any without handrails?: No Home free of loose throw rugs in walkways, pet beds, electrical cords, etc?: Yes Adequate lighting in your home to reduce risk of falls?: Yes Life alert?: No Use of a cane, walker or w/c?: No Grab bars in the bathroom?: Yes Shower chair or bench in shower?: Yes Elevated toilet seat or a handicapped toilet?: No  TIMED UP AND GO:  Was the test performed?  No  Cognitive Function: 6CIT completed        03/21/2024   11:25 AM 03/16/2023   11:37 AM 02/14/2022    2:18 PM 07/16/2020   11:03 AM 07/04/2019    9:01 AM  6CIT Screen  What Year? 0 points 0 points 0 points 0 points 0 points  What month? 0 points 0 points 0 points 0 points 0 points  What time? 0 points 0 points 0 points  0 points  Count back from 20 0 points 0 points 0 points 0 points 0 points  Months in reverse 0 points 0 points 0 points 0 points 0 points  Repeat phrase 0 points 0 points 0 points 0 points 0 points  Total Score 0 points 0 points 0 points  0 points    Immunizations Immunization History  Administered Date(s) Administered   Fluad Quad(high Dose 65+) 05/11/2019, 04/14/2021   Influenza, High Dose Seasonal PF 05/05/2018, 04/03/2022, 04/20/2023   Influenza,inj,Quad PF,6+ Mos 05/01/2015, 05/22/2016   Influenza-Unspecified 06/01/2014, 05/14/2017,  04/27/2020, 04/14/2021   Moderna Covid-19 Vaccine Bivalent Booster 27yrs & up 05/08/2021   PFIZER Comirnaty(Gray Top)Covid-19 Tri-Sucrose Vaccine 04/20/2023   PFIZER(Purple Top)SARS-COV-2 Vaccination 10/10/2019, 11/03/2019, 05/25/2020   Pfizer Covid-19 Vaccine Bivalent Booster 19yrs & up 01/21/2022   Pfizer(Comirnaty)Fall Seasonal Vaccine 12 years and older 07/26/2023   Pneumococcal Conjugate-13 12/16/2016   Pneumococcal Polysaccharide-23 12/30/2017   Td 02/26/2007   Tdap 11/07/2014   Zoster Recombinant(Shingrix) 03/10/2018, 06/24/2018   Zoster, Live 02/22/2013    Screening Tests Health Maintenance  Topic Date Due   COVID-19 Vaccine (8 - Pfizer risk 2024-25 season) 01/23/2024   Hepatitis C Screening  09/21/[birth year]109 (Originally 12/14/1968)   INFLUENZA VACCINE  04/01/2024   DTaP/Tdap/Td (3 - Td or Tdap) 11/06/2024   Medicare Annual Wellness (AWV)  03/21/2025   Pneumococcal Vaccine: 50+ Years  Completed   Zoster Vaccines- Shingrix  Completed   Hepatitis B Vaccines  Aged Out   HPV VACCINES  Aged Out   Meningococcal B Vaccine  Aged Out   Colonoscopy  Discontinued    Health Maintenance  Health Maintenance Due  Topic Date  Due   COVID-19 Vaccine (8 - Pfizer risk 2024-25 season) 01/23/2024   Health Maintenance Items Addressed: See Nurse Notes at the end of this note  Additional Screening:  Vision Screening: Recommended annual ophthalmology exams for early detection of glaucoma and other disorders of the eye. Would you like a referral to an eye doctor? No    Dental Screening: Recommended annual dental exams for proper oral hygiene  Community Resource Referral / Chronic Care Management: CRR required this visit?  No   CCM required this visit?  No   Plan:    I have personally reviewed and noted the following in the patient's chart:   Medical and social history Use of alcohol, tobacco or illicit drugs  Current medications and supplements including opioid prescriptions. Patient  is not currently taking opioid prescriptions. Functional ability and status Nutritional status Physical activity Advanced directives List of other physicians Hospitalizations, surgeries, and ER visits in previous 12 months Vitals Screenings to include cognitive, depression, and falls Referrals and appointments  In addition, I have reviewed and discussed with patient certain preventive protocols, quality metrics, and best practice recommendations. A written personalized care plan for preventive services as well as general preventive health recommendations were provided to patient.   Ellouise VEAR Haws, LPN   [id number]/78/7974   After Visit Summary: (MyChart) Due to this being a telephonic visit, the after visit summary with patients personalized plan was offered to patient via MyChart   Notes: Nothing significant to report at this time.

## 2024-04-04 DIAGNOSIS — C61 Malignant neoplasm of prostate: Secondary | ICD-10-CM | POA: Diagnosis not present

## 2024-04-04 DIAGNOSIS — L814 Other melanin hyperpigmentation: Secondary | ICD-10-CM | POA: Diagnosis not present

## 2024-04-04 DIAGNOSIS — L821 Other seborrheic keratosis: Secondary | ICD-10-CM | POA: Diagnosis not present

## 2024-04-04 DIAGNOSIS — L570 Actinic keratosis: Secondary | ICD-10-CM | POA: Diagnosis not present

## 2024-04-04 DIAGNOSIS — Z85828 Personal history of other malignant neoplasm of skin: Secondary | ICD-10-CM | POA: Diagnosis not present

## 2024-04-04 DIAGNOSIS — Z08 Encounter for follow-up examination after completed treatment for malignant neoplasm: Secondary | ICD-10-CM | POA: Diagnosis not present

## 2024-04-04 DIAGNOSIS — D225 Melanocytic nevi of trunk: Secondary | ICD-10-CM | POA: Diagnosis not present

## 2024-04-11 DIAGNOSIS — C61 Malignant neoplasm of prostate: Secondary | ICD-10-CM | POA: Diagnosis not present

## 2024-04-11 DIAGNOSIS — N400 Enlarged prostate without lower urinary tract symptoms: Secondary | ICD-10-CM | POA: Diagnosis not present

## 2024-06-07 DIAGNOSIS — H2513 Age-related nuclear cataract, bilateral: Secondary | ICD-10-CM | POA: Diagnosis not present

## 2024-06-07 DIAGNOSIS — H353130 Nonexudative age-related macular degeneration, bilateral, stage unspecified: Secondary | ICD-10-CM | POA: Diagnosis not present

## 2024-06-07 DIAGNOSIS — H5203 Hypermetropia, bilateral: Secondary | ICD-10-CM | POA: Diagnosis not present

## 2024-06-07 DIAGNOSIS — H52223 Regular astigmatism, bilateral: Secondary | ICD-10-CM | POA: Diagnosis not present

## 2024-06-07 DIAGNOSIS — H40053 Ocular hypertension, bilateral: Secondary | ICD-10-CM | POA: Diagnosis not present

## 2024-07-04 ENCOUNTER — Encounter: Payer: Self-pay | Admitting: Family Medicine

## 2024-07-04 ENCOUNTER — Ambulatory Visit: Payer: Medicare HMO | Admitting: Family Medicine

## 2024-07-04 ENCOUNTER — Ambulatory Visit: Payer: Self-pay | Admitting: Family Medicine

## 2024-07-04 VITALS — BP 136/72 | HR 53 | Temp 97.4°F | Ht 70.0 in | Wt 198.8 lb

## 2024-07-04 DIAGNOSIS — M25561 Pain in right knee: Secondary | ICD-10-CM | POA: Diagnosis not present

## 2024-07-04 DIAGNOSIS — R739 Hyperglycemia, unspecified: Secondary | ICD-10-CM

## 2024-07-04 DIAGNOSIS — E785 Hyperlipidemia, unspecified: Secondary | ICD-10-CM

## 2024-07-04 DIAGNOSIS — G8929 Other chronic pain: Secondary | ICD-10-CM

## 2024-07-04 DIAGNOSIS — Z0000 Encounter for general adult medical examination without abnormal findings: Secondary | ICD-10-CM

## 2024-07-04 DIAGNOSIS — Z131 Encounter for screening for diabetes mellitus: Secondary | ICD-10-CM | POA: Diagnosis not present

## 2024-07-04 LAB — CBC WITH DIFFERENTIAL/PLATELET
Basophils Absolute: 0 K/uL (ref 0.0–0.1)
Basophils Relative: 0.9 % (ref 0.0–3.0)
Eosinophils Absolute: 0.1 K/uL (ref 0.0–0.7)
Eosinophils Relative: 2.3 % (ref 0.0–5.0)
HCT: 42.5 % (ref 39.0–52.0)
Hemoglobin: 14.3 g/dL (ref 13.0–17.0)
Lymphocytes Relative: 20.3 % (ref 12.0–46.0)
Lymphs Abs: 0.9 K/uL (ref 0.7–4.0)
MCHC: 33.7 g/dL (ref 30.0–36.0)
MCV: 93.7 fl (ref 78.0–100.0)
Monocytes Absolute: 0.5 K/uL (ref 0.1–1.0)
Monocytes Relative: 10.7 % (ref 3.0–12.0)
Neutro Abs: 3 K/uL (ref 1.4–7.7)
Neutrophils Relative %: 65.8 % (ref 43.0–77.0)
Platelets: 226 K/uL (ref 150.0–400.0)
RBC: 4.53 Mil/uL (ref 4.22–5.81)
RDW: 13.4 % (ref 11.5–15.5)
WBC: 4.6 K/uL (ref 4.0–10.5)

## 2024-07-04 LAB — COMPREHENSIVE METABOLIC PANEL WITH GFR
ALT: 21 U/L (ref 0–53)
AST: 26 U/L (ref 0–37)
Albumin: 4.6 g/dL (ref 3.5–5.2)
Alkaline Phosphatase: 71 U/L (ref 39–117)
BUN: 19 mg/dL (ref 6–23)
CO2: 27 meq/L (ref 19–32)
Calcium: 9.3 mg/dL (ref 8.4–10.5)
Chloride: 104 meq/L (ref 96–112)
Creatinine, Ser: 0.99 mg/dL (ref 0.40–1.50)
GFR: 76.08 mL/min (ref >60.00)
Glucose, Bld: 93 mg/dL (ref 70–99)
Potassium: 4.6 meq/L (ref 3.5–5.1)
Sodium: 140 meq/L (ref 135–145)
Total Bilirubin: 0.7 mg/dL (ref 0.2–1.2)
Total Protein: 7.2 g/dL (ref 6.0–8.3)

## 2024-07-04 LAB — LIPID PANEL
Cholesterol: 215 mg/dL — ABNORMAL HIGH (ref 0–200)
HDL: 61.8 mg/dL (ref >39.00)
LDL Cholesterol: 139 mg/dL — ABNORMAL HIGH (ref 0–99)
NonHDL: 153.64
Total CHOL/HDL Ratio: 3
Triglycerides: 73 mg/dL (ref 0.0–149.0)
VLDL: 14.6 mg/dL (ref 0.0–40.0)

## 2024-07-04 LAB — HEMOGLOBIN A1C: Hgb A1c MFr Bld: 5.6 % (ref 4.6–6.5)

## 2024-07-04 NOTE — Patient Instructions (Addendum)
 We have placed a referral for you today to sports medicine with [facility]- please call their # if you do not hear within a week (may be listed below or you may see mychart message within a few days with #).   Please stop by lab before you go If you have mychart- we will send your results within 3 business days of us  receiving them.  If you do not have mychart- we will call you about results within 5 business days of us  receiving them.  *please also note that you will see labs on mychart as soon as they post. I will later go in and write notes on them- will say notes from Dr. Katrinka   No changes today unless labs lead us  to make changes  Recommended follow up: Return in about 1 year (around 07/04/2025) for physical or sooner if needed.Schedule b4 you leave.

## 2024-07-04 NOTE — Progress Notes (Signed)
 Phone: [phone]   Subjective:  Patient presents today for their annual physical. Chief complaint-noted.   See problem oriented charting- ROS- full  review of systems was completed and negative  except for topics noted under acute/chronic concerns  The following were reviewed and entered/updated in epic: Past Medical History:  Diagnosis Date   Allergic rhinitis, seasonal    Allergy    Arthritis    ED (erectile dysfunction)    History of adenomatous polyp of colon    History of kidney stones    History of prostatitis 08/2019   History of syncope    per pt episode 2018 (echo in epic 04-27-2017 ef 55-60%, mild LAE) , told vasovagal ,  stated has had previous episode yrs ago had normal work-up,  pt states happens when is stress and needs to eat/ drink   Hyperlipidemia    PAC (premature atrial contraction)    Prostate cancer [facility])    urologist-- dr watt-- dx 03/ 2022, Gleason 4+4, PSA 10   Wears glasses    Patient Active Problem List   Diagnosis Date Noted   History of prostate cancer 01/01/2021    Priority: High   Hyperlipidemia 06/14/2014    Priority: Medium    PAC (premature atrial contraction) 07/04/2019    Priority: Low   Actinic keratosis 01/07/2010    Priority: Low   ALLERGIC RHINITIS, SEASONAL 06/03/2007    Priority: Low   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    Priority: Low   History of colonic polyps 03/04/2007    Priority: Low   Syncope 04/16/2017   Past Surgical History:  Procedure Laterality Date   COLONOSCOPY     last one 04-27-2017 by gessner   CYSTOSCOPY  04/30/2021   Procedure: CYSTOSCOPY FLEXIBLE;  Surgeon: Watt Rush, MD;  Location: [facility];  Service: Urology;;  NO SEEDS FOUND IN BLADDER   KNEE ARTHROSCOPY Right    late 1970s   PROSTATE BIOPSY     RADIOACTIVE SEED IMPLANT N/A 04/30/2021   Procedure: RADIOACTIVE SEED IMPLANT/BRACHYTHERAPY IMPLANT;  Surgeon: Watt Rush, MD;  Location: [facility];  Service: Urology;   Laterality: N/A;  61 seeds implanted   SPACE OAR INSTILLATION N/A 04/30/2021   Procedure: SPACE OAR INSTILLATION;  Surgeon: Watt Rush, MD;  Location: [facility];  Service: Urology;  Laterality: N/A;    Family History  Problem Relation Age of Onset   Tuberculosis Mother    Arthritis Maternal Grandmother    Emphysema Maternal Grandfather        smoker   Breast cancer Neg Hx    Prostate cancer Neg Hx    Colon cancer Neg Hx    Pancreatic cancer Neg Hx    Esophageal cancer Neg Hx     Medications- reviewed and updated Current Outpatient Medications  Medication Sig Dispense Refill   alfuzosin (UROXATRAL) 10 MG 24 hr tablet      cetirizine (ZYRTEC) 10 MG tablet Take 10 mg by mouth at bedtime.     cholecalciferol (VITAMIN D3) 25 MCG (1000 UNIT) tablet Take 1,000 Units by mouth daily.     fexofenadine (ALLEGRA) 180 MG tablet Take 180 mg by mouth daily as needed.     fluticasone  (FLONASE ) 50 MCG/ACT nasal spray Place 2 sprays into both nostrils daily. 16 g 3   ibuprofen (ADVIL,MOTRIN) 200 MG tablet Take 400 mg by mouth every 6 (six) hours as needed.     MAGNESIUM CITRATE PO Take 2 tablets by mouth daily.  melatonin 5 MG TABS Take 5 mg by mouth.     Multiple Vitamins-Minerals (ICAPS AREDS 2 PO) Take 2 tablets by mouth daily.     No current facility-administered medications for this visit.    Allergies-reviewed and updated No Known Allergies  Social History   Social History Narrative   Long term partner - living together since 2019. Divorced twice. 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 Youngest daughter mela planned January 2024- applying late 2024      Adopted by grandparents.      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 Oropharynx normal Neck: no thyromegaly CV: RRR no murmurs rubs or gallops Lungs: CTAB no crackles, wheeze, rhonchi Abdomen: soft/nontender/nondistended/normal bowel sounds. No rebound or guarding.  Ext: no edema Skin: warm, dry Neuro: grossly normal, moves all extremities, PERRLA Knee: Normal to inspection with no erythema or effusion or obvious bony abnormalities. Palpation normal with no warmth or joint line tenderness or patellar tenderness or condyle tenderness.- although with flexion and extension notes some pain at top of patellar tendon.  ROM normal in flexion and extension and lower leg rotation. Ligaments with solid consistent endpoints including ACL, PCL, LCL, MCL. Negative Mcmurray's and provocative meniscal tests. Non painful patellar compression. Patellar and quadriceps tendons unremarkable other than pain noted above Hamstring and quadriceps strength is normal.    Assessment and Plan  73 y.o. male presenting for annual physical.  Health Maintenance counseling: 1. Anticipatory guidance: Patient counseled regarding regular dental exams -q6 months, eye exams -yearly drussen right eye on areds,  avoiding smoking and second hand smoke , limiting alcohol to 2 beverages per day - variable with vacation higher but usually 10 a week or so or less, no illicit drugs .   2. Risk factor reduction:  Advised patient of need for regular exercise and diet rich and fruits and vegetables to reduce risk of heart attack and stroke.  Exercise-still getting many miles on his step counter around 11,000 steps a day- was down earlier in the year with knee issues - see below.  Diet/weight management-Down 4 pounds from last year- reasonably healthy eater in general.  Wt  Readings from Last 3 Encounters:  07/04/24 198 lb 12.8 oz (90.2 kg)  03/21/24 196 lb (88.9 kg)  07/03/23 202 lb 6.4 oz (91.8 kg)  3. Immunizations/screenings/ancillary studies-just had COVID vaccine 4 days ago Immunization History  Administered Date(s) Administered   Fluad Quad(high Dose 65+) 05/11/2019, 04/14/2021, 05/02/2024   INFLUENZA, HIGH DOSE SEASONAL PF 05/05/2018, 04/03/2022, 04/20/2023   Influenza,inj,Quad PF,6+ Mos 05/01/2015, 05/22/2016   Influenza-Unspecified 06/01/2014, 05/14/2017, 04/27/2020, 04/14/2021   Moderna Covid-19 Vaccine Bivalent Booster 62yrs & up 05/08/2021   PFIZER Comirnaty(Gray Top)Covid-19 Tri-Sucrose Vaccine 04/20/2023   PFIZER(Purple Top)SARS-COV-2 Vaccination 10/10/2019, 11/03/2019, 05/25/2020   Pfizer Covid-19 Vaccine Bivalent Booster 73yrs & up 01/21/2022   Pfizer(Comirnaty)Fall Seasonal Vaccine 12 years and older 07/26/2023   Pneumococcal Conjugate-13 12/16/2016   Pneumococcal Polysaccharide-23 12/30/2017  Td 02/26/2007   Tdap 11/07/2014   Unspecified SARS-COV-2 Vaccination 07/01/2024   Zoster Recombinant(Shingrix) 03/10/2018, 06/24/2018   Zoster, Live 02/22/2013   4. Prostate cancer screening-see below  5. Colon cancer screening - tubular adenoma in 2024 with no plan for repeat due to age unless symptoms 6. Skin cancer screening-sees Dr. Dietrich twice yearly- due to prior infiltrative basal cell but may graduate next year back to year. advised regular sunscreen use. Denies worrisome, changing, or new skin lesions.  7. Smoking associated screening (lung cancer screening, AAA screen 65-75, UA)- never smoker 8. STD screening - opts out- long term partner  Status of chronic or acute concerns   #social update- daughter accepted into 2 medical schools and decided she didn't want to be a physician.  technician in biotech now.   #home blood pressure average 130 or so <80  # right knee pain S:been going on since may . Started while walking and felt a  sharp pain back in may and has felt less stable- brace helps some- and started to improve.  -then 3 weeks ago jumped over a stream and pain recurred- sharp again - pain going back down- mostly hills or steps that triggers it.  A/P: ongoing pain in very active walker- want to be proactive for his right knee pain- refer to sports medicine  for their expert opinion  #prostate cancer (initial biopsy benign 11/25/2019 but later found to be cancer in 2022)-  followed with Dr. Matilda now Dr. Devere- history of radiation and androgen deprivation -starting to trend up slightly -Does require alfuzosin long term  - q6 month follow up PSA and close follow up and un measurable. Hot flashes have almost disappeared  #hyperlipidemia-CT calcium scoring score of 0 08/13/2022 S: Medication:none  -In addition he has low interest in statin Lab Results  Component Value Date   CHOL 188 07/03/2023   HDL 64.60 07/03/2023   LDLCALC 110 (H) 07/03/2023   LDLDIRECT 141.4 01/10/2010   TRIG 66.0 07/03/2023   CHOLHDL 3 07/03/2023  A/P: lipids only mildly high but thankfully low risk with CT calcium reassuring- recheck lipids and calcium score in 2028   Pulmonary nodule on CT calcium scoring 4 mm- no further follow up   # Hyperglycemia/insulin resistance/prediabetes- slightly high sugar last year S:  Medication: none Lab Results  Component Value Date   HGBA1C 5.2 09/24/2015   A/P: update a1c with labs  Recommended follow up: Return in about 1 year (around 07/04/2025) for physical or sooner if needed.Schedule b4 you leave. Future Appointments  Date Time Provider Department Center  03/22/2025 11:20 AM [facility] ANNUAL WELLNESS VISIT 1 [facility]    Lab/Order associations: fasting   ICD-10-CM   1. Preventative health care  Z00.00     2. Chronic pain of right knee  M25.561 Ambulatory referral to Sports Medicine   G89.29     3. Hyperlipidemia, unspecified hyperlipidemia type  E78.5 Comprehensive  metabolic panel with GFR    CBC with Differential/Platelet    Lipid panel    4. Screening for diabetes mellitus  Z13.1 Hemoglobin A1c    5. Hyperglycemia  R73.9 Hemoglobin A1c      No orders of the defined types were placed in this encounter.   Return precautions advised.  Garnette Lukes, MD

## 2024-07-14 NOTE — Progress Notes (Unsigned)
 Randy [name] Randy Rogers [address] [place] [address] Phone: [phone]   Assessment and Plan:     1. Chronic pain of right knee (Primary) 2. 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  My interpretation: No acute fracture or dislocation - Use topical Voltaren gel over areas of pain - Continue to use patellofemoral brace as needed and could consider patellofemoral strap during physical activity - Start HEP for knee and patellar tendinitis - Offered meloxicam course, however patient's flare of knee pain is overall improving with conservative treatment, so we will not prescribe Rogers at this time, though could be considered at future visit  15 additional minutes spent for educating Therapeutic Home Exercise Program.  This included exercises focusing on stretching, strengthening, with focus on eccentric aspects.   Long term goals include an improvement in range of motion, strength, endurance as well as avoiding reinjury. Patient's frequency would include in 1-2 times a day, 3-5 times a week for a duration of 6-12 weeks. Proper technique shown and discussed handout in great detail with ATC.  All questions were discussed and answered.     Pertinent previous records reviewed include none   Follow Up: As needed if no improvement or worsening of symptoms.  Patient could call for meloxicam course if pain returns   Subjective:   I, Randy Rogers, am serving as a neurosurgeon for Doctor Randy Rogers  Chief Complaint: right knee pain   HPI:   07/15/2024 Patient is a 73 year old male with right knee pain. Patient states pain started may 2025. He was hiking and then he felt a sharp pain. He was wearing a brace. Pain resolved. A month ago he was hiking and felt a sharp pain. He averages 5-6  miles a day. No meds for the pain. No numbness or tingling. Anterior knee pain no radiating pain.    Relevant Historical Information: History of PA-C, history of prostate cancer  Additional pertinent review of systems negative.   Current Outpatient Medications:    alfuzosin (UROXATRAL) 10 MG 24 hr tablet, , Disp: , Rfl:    cetirizine (ZYRTEC) 10 MG tablet, Take 10 mg by mouth at bedtime., Disp: , Rfl:    cholecalciferol (VITAMIN D3) 25 MCG (1000 UNIT) tablet, Take 1,000 Units by mouth daily., Disp: , Rfl:    fexofenadine (ALLEGRA) 180 MG tablet, Take 180 mg by mouth daily as needed., Disp: , Rfl:    fluticasone  (FLONASE ) 50 MCG/ACT nasal spray, Place 2 sprays into both nostrils daily., Disp: 16 g, Rfl: 3   ibuprofen (ADVIL,MOTRIN) 200 MG tablet, Take 400 mg by mouth every 6 (six) hours as needed., Disp: , Rfl:    MAGNESIUM CITRATE PO, Take 2 tablets by mouth daily., Disp: , Rfl:    melatonin 5 MG TABS, Take 5 mg by mouth., Disp: , Rfl:    Multiple Vitamins-Minerals (ICAPS AREDS 2 PO), Take 2 tablets by mouth daily., Disp: , Rfl:    Objective:     Vitals:   07/15/24 0853  Pulse: 66  SpO2: 97%  Weight: 198 lb (89.8 kg)  Height: 5' 10 (1.778 m)      Body mass index is 28.41 kg/m.    Physical Exam:    General:  awake, alert oriented, no acute distress nontoxic Skin: no suspicious lesions or rashes Neuro:sensation intact and  strength 5/5 with no deficits, no atrophy, normal muscle tone Psych: No signs of anxiety, depression or other mood disorder  Right knee: No swelling No deformity Neg fluid wave, joint milking ROM Flex 110, Ext 0 NTTP over the quad tendon, medial fem condyle, lat fem condyle, patella, patella tendon, tibial tuberostiy, fibular head, posterior fossa, pes anserine bursa, gerdy's tubercle, medial jt line, lateral jt line Neg anterior and posterior drawer Neg lachman Negative varus stress Negative valgus stress Negative McMurray Positive Thessaly Mild  anterior knee discomfort with resisted knee extension Gait normal    Electronically signed by:  Randy [name] Randy Rogers 9:33 AM 07/15/24

## 2024-07-15 ENCOUNTER — Ambulatory Visit: Admitting: Sports Medicine

## 2024-07-15 ENCOUNTER — Ambulatory Visit (INDEPENDENT_AMBULATORY_CARE_PROVIDER_SITE_OTHER)

## 2024-07-15 VITALS — HR 66 | Ht 70.0 in | Wt 198.0 lb

## 2024-07-15 DIAGNOSIS — M25561 Pain in right knee: Secondary | ICD-10-CM | POA: Diagnosis not present

## 2024-07-15 DIAGNOSIS — G8929 Other chronic pain: Secondary | ICD-10-CM | POA: Diagnosis not present

## 2024-07-15 DIAGNOSIS — M7651 Patellar tendinitis, right knee: Secondary | ICD-10-CM

## 2024-07-15 DIAGNOSIS — M25461 Effusion, right knee: Secondary | ICD-10-CM | POA: Diagnosis not present

## 2024-07-15 NOTE — Patient Instructions (Signed)
 Voltaren gel over areas of pain   May continue using knee brace  Can use patellar knee strap when physically active   Knee HEP   Call if you would like a meloxicam prescription to decrease inflammation   As needed follow up

## 2024-07-18 ENCOUNTER — Ambulatory Visit: Payer: Self-pay | Admitting: Sports Medicine

## 2025-03-22 ENCOUNTER — Ambulatory Visit

## 2025-07-06 ENCOUNTER — Encounter: Admitting: Family Medicine
